# Patient Record
Sex: Female | Born: 1940 | Race: White | Hispanic: No | Marital: Married | Smoking: Never smoker
Health system: Southern US, Community
[De-identification: ages and names within clinical notes are randomized; demographics above are authoritative.]

## PROBLEM LIST (undated history)

## (undated) DIAGNOSIS — K219 Gastro-esophageal reflux disease without esophagitis: Secondary | ICD-10-CM

## (undated) DIAGNOSIS — I10 Essential (primary) hypertension: Secondary | ICD-10-CM

## (undated) DIAGNOSIS — G114 Hereditary spastic paraplegia: Secondary | ICD-10-CM

## (undated) DIAGNOSIS — G959 Disease of spinal cord, unspecified: Secondary | ICD-10-CM

## (undated) DIAGNOSIS — G709 Myoneural disorder, unspecified: Secondary | ICD-10-CM

## (undated) DIAGNOSIS — E7800 Pure hypercholesterolemia, unspecified: Secondary | ICD-10-CM

## (undated) DIAGNOSIS — Z87442 Personal history of urinary calculi: Secondary | ICD-10-CM

## (undated) HISTORY — PX: TONSILLECTOMY: SUR1361

## (undated) HISTORY — PX: BREAST SURGERY: SHX581

---

## 1999-11-15 ENCOUNTER — Encounter: Payer: Self-pay | Admitting: Internal Medicine

## 2008-08-28 ENCOUNTER — Encounter (INDEPENDENT_AMBULATORY_CARE_PROVIDER_SITE_OTHER): Payer: Self-pay | Admitting: *Deleted

## 2009-07-29 ENCOUNTER — Encounter: Payer: Self-pay | Admitting: Internal Medicine

## 2010-03-16 ENCOUNTER — Encounter: Payer: Self-pay | Admitting: Internal Medicine

## 2010-03-17 ENCOUNTER — Encounter: Payer: Self-pay | Admitting: Internal Medicine

## 2010-03-25 ENCOUNTER — Encounter (INDEPENDENT_AMBULATORY_CARE_PROVIDER_SITE_OTHER): Payer: Self-pay | Admitting: *Deleted

## 2010-05-25 ENCOUNTER — Encounter: Payer: Self-pay | Admitting: Internal Medicine

## 2010-06-03 ENCOUNTER — Encounter (INDEPENDENT_AMBULATORY_CARE_PROVIDER_SITE_OTHER): Payer: Self-pay | Admitting: *Deleted

## 2010-06-03 DIAGNOSIS — N6019 Diffuse cystic mastopathy of unspecified breast: Secondary | ICD-10-CM

## 2010-06-03 DIAGNOSIS — R05 Cough: Secondary | ICD-10-CM

## 2010-06-03 DIAGNOSIS — K219 Gastro-esophageal reflux disease without esophagitis: Secondary | ICD-10-CM | POA: Insufficient documentation

## 2010-06-03 DIAGNOSIS — K5730 Diverticulosis of large intestine without perforation or abscess without bleeding: Secondary | ICD-10-CM | POA: Insufficient documentation

## 2010-06-03 DIAGNOSIS — R1319 Other dysphagia: Secondary | ICD-10-CM

## 2010-06-03 DIAGNOSIS — E538 Deficiency of other specified B group vitamins: Secondary | ICD-10-CM

## 2010-06-08 ENCOUNTER — Ambulatory Visit: Payer: Self-pay | Admitting: Internal Medicine

## 2010-06-08 DIAGNOSIS — G114 Hereditary spastic paraplegia: Secondary | ICD-10-CM

## 2010-07-25 ENCOUNTER — Telehealth: Payer: Self-pay | Admitting: Internal Medicine

## 2010-07-26 ENCOUNTER — Ambulatory Visit: Payer: Self-pay | Admitting: Internal Medicine

## 2010-07-28 ENCOUNTER — Encounter: Payer: Self-pay | Admitting: Internal Medicine

## 2010-10-20 NOTE — Procedures (Signed)
Summary: Colonoscopy  Patient: Valerie Palmer Note: All result statuses are Final unless otherwise noted.  Tests: (1) Colonoscopy (COL)   COL Colonoscopy           DONE     [facility]     [address]     [place], [place]  [address]           COLONOSCOPY PROCEDURE REPORT           PATIENT:  Valerie Palmer, Valerie Palmer  MR#:  #[id number]     BIRTHDATE:  1941-01-19, 69 yrs. old  GENDER:  female     ENDOSCOPIST:  Hedwig Morton. Juanda Chance, MD     REF. BY:  Desmond Dike, M.D.     PROCEDURE DATE:  07/26/2010     PROCEDURE:  Colonoscopy 295[address]     ASA CLASS:  Class II     INDICATIONS:  family history of colon cancer mother with colon     cancer. last colon 2001, anemia     MEDICATIONS:   Versed 3 mg, Fentanyl 50 mcg, Benadryl 12.5 mg           DESCRIPTION OF PROCEDURE:   After the risks benefits and     alternatives of the procedure were thoroughly explained, informed     consent was obtained.  Digital rectal exam was performed and     revealed no rectal masses.   The LB PCF-H180AL [id number] endoscope     was introduced through the anus and advanced to the cecum, which     was identified by the ileocecal valve, without limitations.  The     quality of the prep was good, using MiraLax.  The instrument was     then slowly withdrawn as the colon was fully examined.     <<PROCEDUREIMAGES>>           FINDINGS:  Mild diverticulosis was found in the sigmoid colon (see     image4).  Internal hemorrhoids were found (see image6).  This was     otherwise a normal examination of the colon (see image5, image3,     image2, and image1).   Retroflexed views in the rectum revealed no     abnormalities.    The scope was then withdrawn from the patient     and the procedure completed.           COMPLICATIONS:  None     ENDOSCOPIC IMPRESSION:     1) Mild diverticulosis in the sigmoid colon     2) Internal hemorrhoids     3) Otherwise normal examination     RECOMMENDATIONS:     1) high fiber diet     REPEAT EXAM:   In 5 year(s) for.           ______________________________     Hedwig Morton. Juanda Chance, MD           CC:           n.     eSIGNED:   Hedwig Morton. Brodie at 07/26/2010 11:20 AM           Valerie Palmer, #[id number]  Note: An exclamation mark (!) indicates a result that was not dispersed into the flowsheet. Document Creation Date: 07/26/2010 11:20 AM _______________________________________________________________________  (1) Order result status: Final Collection or observation date-time: 07/26/2010 11:02 Requested date-time:  Receipt date-time:  Reported date-time:  Referring Physician:   Ordering Physician: Lina Sar [phone]) Specimen Source:  Source: Valerie Palmer Order Number:  [id number] Lab site:   Appended Document: Colonoscopy    Clinical Lists Changes  Observations: Added new observation of COLONNXTDUE: 07/2015 (07/26/2010 13:17)

## 2010-10-20 NOTE — Assessment & Plan Note (Signed)
Summary: dysphagia, cough...em   History of Present Illness Visit Type: Initial Consult Primary GI MD: Lina Sar MD Primary Provider: Allena Napoleon Requesting Provider: Allena Napoleon Chief Complaint: Dysphagia and cough History of Present Illness:   This is a 50 with white female with a 6 week history of a cough which started with what sounds like an upper respiratory infection. She was treated with antibiotics for 3 weeks and has improved significantly but she still has some nocturnal dry cough. She also has had some esophageal reflux which has improved on Prilosec 20 mg twice a day. A modified barium swallow showed no evidence of aspiration. She has a family history of colon cancer in her mother and underwent a colonoscopy in February 2001 which was normal except for mild diverticulosis of the left colon. She is chronically anemic. In September 2010, her hemoglobin was 11.7 and her MCV was 95. Several weeks ago, her hemoglobin was at 11.9, hematocrit 38 and MCV was again 95. She denies any dysphagia or odynophagia. She has had hoarseness. Her weight has remained stable.   GI Review of Systems    Reports abdominal pain, belching, and  heartburn.      Denies acid reflux, bloating, chest pain, dysphagia with liquids, dysphagia with solids, loss of appetite, nausea, vomiting, vomiting blood, weight loss, and  weight gain.      Reports constipation.     Denies anal fissure, black tarry stools, change in bowel habit, diarrhea, diverticulosis, fecal incontinence, heme positive stool, hemorrhoids, irritable bowel syndrome, jaundice, light color stool, liver problems, rectal bleeding, and  rectal pain.    Current Medications (verified): 1)  Diclofenac Potassium 50 Mg Tabs (Diclofenac Potassium) .Marland Kitchen.. 1 By Mouth Once Daily 2)  Omeprazole 20 Mg Cpdr (Omeprazole) .Marland Kitchen.. 1 By Mouth Two Times A Day 3)  Vesicare 10 Mg Tabs (Solifenacin Succinate) .... Take 1 Tablet By Mouth Once A Day 4)  Vitamin D3  50000 Unit Caps (Cholecalciferol) .Marland Kitchen.. 1 By Mouth Every Other Week 5)  Simvastatin 40 Mg Tabs (Simvastatin) .... Take 1 Tab By Mouth At Bedtime 6)  Folplex 2.2 2.2-25-0.5 Mg Tabs (Folic Acid-Vit B6-Vit B12) .... Take 1 Tablet By Mouth Once Daily 7)  Benicar 40 Mg Tabs (Olmesartan Medoxomil) .Marland Kitchen.. 1 By Mouth Once Daily 8)  Metamucil Plus Calcium  Caps (Psyllium-Calcium) .... 3 By Mouth Every Am and 2 By Mouth At Bedtime  Allergies (verified): 1)  ! * Diilaudid 2)  ! * Opiods 3)  ! 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 Family History of Colon Cancer: Mother Family History of Diabetes: Mother Family History of Heart Disease: Father  Social History: Occupation: [employer]  retired Alcohol Use - 1 per day Illicit Drug Use - no Married-husband in nursing home Daily Caffeine Use 1-2 per day coffee  Review of Systems       The patient complains of cough.  The patient denies allergy/sinus, anemia, anxiety-new, arthritis/joint pain, back pain, blood in urine, breast changes/lumps, change in vision, confusion, coughing up blood, depression-new, fainting, fatigue, fever, headaches-new, hearing problems, heart murmur, heart rhythm changes, itching, menstrual pain, muscle pains/cramps, night sweats, nosebleeds, pregnancy symptoms, shortness of breath, skin rash,  sleeping problems, sore throat, swelling of feet/legs, swollen lymph glands, thirst - excessive , urination - excessive , urination changes/pain, urine leakage, vision changes, and voice change.         Pertinent  positive and negative review of systems were noted in the above HPI. All other ROS was otherwise negative.   Vital Signs:  Patient profile:   71 year old female Height:      64 inches Weight:      126 pounds BMI:     21.71 BSA:     1.61 Pulse rate:   80 / minute Pulse rhythm:   regular BP sitting:   104 / 64  (left arm)  Vitals Entered By: Merri Ray CMA (AAMA) (June 08, 2010 9:30 AM)  Physical Exam  General:  alert and oriented, her voice is normal today. Mouth:  no thrush. Neck:  Supple; no masses or thyromegaly. Lungs:  Clear throughout to auscultation. Heart:  Regular rate and rhythm; no murmurs, rubs,  or bruits. Rapid S1-S2. Abdomen:  soft mildly protuberant abdomen without tenderness. Increased tympany in the upper abdomen. Normoactive bowel sounds. Rectal:  soft Hemoccult negative stool. Extremities:  patient has spastic paraplegia, and ambulates with a walker. She has atrophy of her muscles distally in both legs. Skin:  Intact without significant lesions or rashes. Psych:  Alert and cooperative. Normal mood and affect.   Impression & Recommendations:  Problem # 1:  Family Hx of COLON CANCER (ICD-153.9)  Patient is due for a repeat colonoscopy. Her last one was 10 years ago. We will schedule her for a colonoscopy.  Orders: Colon/Endo (Colon/Endo)  Problem # 2:  GERD (ICD-530.81)  Patient has gastroesophageal reflux aggravated by coughing. Her cough may also be partly a result of the reflux. We will proceed with an upper endoscopy to rule out Barrett's esophagus or Candida esophagitis.  Orders: Colon/Endo (Colon/Endo)  Problem # 3:  DYSPHAGIA (UVO-536.64)  Patient's dysphagia has actually improved on Prilosec 20 mg twice a day.  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 She is followed by neurology.  Patient Instructions: 1)  continue Prilosec to 20 mg twice a  day. 2)  Antireflux measures. 3)  Schedule colonoscopy. 4)  Schedule upper endoscopy with possible dilatation if indicated. 5)  Copy sent to : Dr Sheria Lang 6)  The medication list was reviewed and reconciled.  All changed / newly prescribed medications were explained.  A complete medication list was provided to the patient / caregiver. Prescriptions: DULCOLAX 5 MG  TBEC (BISACODYL) Day before procedure take 2 at 3pm and 2 at 8pm.  #4 x 0   Entered by:   Lamona Curl CMA (AAMA)   Authorized by:   Hart Carwin MD   Signed by:   Lamona Curl CMA (AAMA) on 06/08/2010   Method used:   Electronically to        [facility] [address] [phone]* (retail)       [address] [place], [place]  [address]       Ph: [phone]       Fax: [phone]   RxID:   [id number] REGLAN 10 MG  TABS (METOCLOPRAMIDE HCL) As per prep instructions.  #2 x 0   Entered by:   Lamona Curl CMA (AAMA)   Authorized by:   Hart Carwin MD   Signed by:   Lamona Curl CMA (AAMA)  on 06/08/2010   Method used:   Electronically to        [facility]. [phone]* (retail)       [address] [place], [place]  [address]       Ph: [phone]       Fax: [phone]   RxID:   [id number] MIRALAX   POWD (POLYETHYLENE GLYCOL 3350) As per prep  instructions.  #255gm x 0   Entered by:   Lamona Curl CMA (AAMA)   Authorized by:   Hart Carwin MD   Signed by:   Lamona Curl CMA (AAMA) on 06/08/2010   Method used:   Electronically to        [facility] [address] [phone]* (retail)       [address] [place], [place]  [address]       Ph: [phone]       Fax: [phone]   RxID:   [id number]

## 2010-10-20 NOTE — Letter (Signed)
Summary: Nurse Note [facility]  Nurse Note [facility]   Imported By: Lamona Curl CMA (AAMA) 07/27/2010 09:01:17  _____________________________________________________________________  External Attachment:    Type:   Image     Comment:   External Document

## 2010-10-20 NOTE — Procedures (Signed)
Summary: Upper Endoscopy  Patient: Valerie Palmer Note: All result statuses are Final unless otherwise noted.  Tests: (1) Upper Endoscopy (EGD)   EGD Upper Endoscopy       DONE     [facility]     [address]     [place], [place]  [address]           OPERATIVE PROCEDURE REPORT           PATIENT:  Adison, Reifsteck  MR#:  #[id number]     BIRTHDATE:  [birth year]  GENDER:  female           ENDOSCOPIST:  Hedwig Morton. Juanda Chance, MD     ASSISTANT:           PROCEDURE DATE:  07/26/2010     PRE-PROCEDURE PREPERATION:     PRE-PROCEDURE PHYSICAL:  Desmond Dike, MD     PROCEDURE:  EGD with biopsy, [phone]     ASA CLASS:  Class II     INDICATIONS:  1) cough  2) GERD anemia,           MEDICATIONS:  Versed 7 mg, Benadryl 32.5 mg     TOPICAL ANESTHETIC:  Exactacain Spray           DESCRIPTION OF PROCEDURE:   After the risks benefits and     alternatives of the procedure were thoroughly explained, informed     consent was obtained.  The LB GIF-H180 [id number] endoscope was     introduced through the mouth and advanced to the second portion of     the duodenum, without limitations.  The instrument was slowly     withdrawn as the mucosa was fully examined.     <<PROCEDUREIMAGES>>           A hiatal hernia was found (see image1 and image6). 3 cm     nonreducible hiatal hernia  Normal GE junction was noted. With     standard forceps, a biopsy was obtained and sent to pathology (see     image1).  Otherwise the examination was normal. With standard     forceps, a biopsy was obtained and sent to pathology (see image5,     image4, image3, and image2). s/p duodenal biopsy to r/o villous     atrophy    Retroflexed views revealed no abnormalities.    The     scope was then withdrawn from the patient and the procedure     terminated.           COMPLICATIONS:  None           IMPRESSION:  1) Hiatal hernia     2) Normal GE junction     3) Otherwise normal examination     s/p biopsies from g-e junction and  from duodenum     RECOMMENDATIONS:  1) Await biopsy results     2) Anti-reflux regimen to be follow     continue Prelosec 20 mg po bid, cough contributing to the reflus     and vice versa           REPEAT EXAM:  In 0 year(s) for.     DISCHARGE INSTRUCTIONS:           ______________________________     Hedwig Morton. Juanda Chance, MD           CPT CODES:           DIAGNOSIS CODES:  CC:           n.     eSIGNED:   Dora M. Brodie at 07/26/2010 11:11 AM           Braulio Conte, #[id number]  Note: An exclamation mark (!) indicates a result that was not dispersed into the flowsheet. Document Creation Date: 07/26/2010 11:11 AM _______________________________________________________________________  (1) Order result status: Final Collection or observation date-time: 07/26/2010 10:36 Requested date-time:  Receipt date-time:  Reported date-time:  Referring Physician:   Ordering Physician: Lina Sar [phone]) Specimen Source:  Source: Launa Grill Order Number: [id number] Lab site:

## 2010-10-20 NOTE — Letter (Signed)
Summary: Patient [facility] Biopsy Results  [facility]  [address] [place], [place] [address]   Phone: [phone]  Fax: [phone]        July 28, 2010 MRN: [id number]    [facility] [address] [place], [place]  [address]    Dear Ms. Dario,  I am pleased to inform you that the biopsies taken during your recent endoscopic examination did not show any evidence of cancer upon pathologic examination.  Additional information/recommendations:  __No further action is needed at this time.  Please follow-up with      your primary care physician for your other healthcare needs.  __ Please call [phone] to schedule a return visit to review      your condition.  _x_ Continue with the treatment plan as outlined on the day of your      exam.  __   Please call us if you are having persistent problems or have questions about your condition that have not been fully answered at this time.  Sincerely,  Hart Carwin MD  This letter has been electronically signed by your physician.  Appended Document: Patient Notice-Endo Biopsy Results letter mailed 11.15.2011

## 2010-10-20 NOTE — Letter (Signed)
Summary: New Patient letter  [facility]  [address] [place], [place] [address]   Phone: [phone]  Fax: [phone]       03/25/2010 MRN: [id number]  [facility] [address] [place], [place]  [address]  Dear Valerie Palmer,  Welcome to the Gastroenterology Division at [facility].    You are scheduled to see Dr.  Lina Sar on June 08, 2010 at 9:15am on the 3rd floor at [facility], [address]  We ask that you try to arrive at our office 15 minutes prior to your appointment time to allow for check-in.  We would like you to complete the enclosed self-administered evaluation form prior to your visit and bring it with you on the day of your appointment.  We will review it with you.  Also, please bring a complete list of all your medications or, if you prefer, bring the medication bottles and we will list them.  Please bring your insurance card so that we may make a copy of it.  If your insurance requires a referral to see a specialist, please bring your referral form from your primary care physician.  Co-payments are due at the time of your visit and may be paid by cash, check or credit card.     Your office visit will consist of a consult with your physician (includes a physical exam), any laboratory testing he/she may order, scheduling of any necessary diagnostic testing (e.g. x-ray, ultrasound, CT-scan), and scheduling of a procedure (e.g. Endoscopy, Colonoscopy) if required.  Please allow enough time on your schedule to allow for any/all of these possibilities.    If you cannot keep your appointment, please call [phone] to cancel or reschedule prior to your appointment date.  This allows [place] the opportunity to schedule an appointment for another patient in need of care.  If you do not cancel or reschedule by 5 p.m. the business day prior to your appointment date, you will be charged a $50.00 late cancellation/no-show fee.    Thank you for  choosing [facility] for your medical needs.  We appreciate the opportunity to care for you.  Please visit [place] at our website  to learn more about our practice.                     Sincerely,                                                             The Gastroenterology Division

## 2010-10-20 NOTE — Letter (Signed)
Summary: [facility]  [facility]   Imported By: Lamona Curl CMA (AAMA) 06/03/2010 16:55:38  _____________________________________________________________________  External Attachment:    Type:   Image     Comment:   External Document

## 2010-10-20 NOTE — Progress Notes (Signed)
Summary: ? Allergy to Medication  Phone Note Outgoing Call   Action Taken: Phone Call Completed Summary of Call: I have called the pt concerning "allergy to a pain med" used 2 yrs ago at [facility], it was not Codeine. Dr Saddie Benders has the information, will call him. Initial call taken by: Hart Carwin MD,  July 25, 2010 1:49 PM  Follow-up for Phone Call        Per [facility] handwritten notes that were faxed to our office, patient has "shivers" with Demerol (meperidine). Patient's allergy list has been updated in our system. Follow-up by: Lamona Curl CMA Duncan Dull),  July 25, 2010 2:20 PM   New Allergies: ! * DIILAUDID ! * ANALGESICS ! DEMEROL New Allergies: ! * DIILAUDID ! * ANALGESICS ! DEMEROL

## 2010-10-20 NOTE — Letter (Signed)
Summary: [facility]   Imported By: Sherian Rein 06/04/2010 08:35:32  _____________________________________________________________________  External Attachment:    Type:   Image     Comment:   External Document

## 2010-10-20 NOTE — Letter (Signed)
Summary: [facility] Instructions  [facility]  [address] [place], [place] [address]   Phone: [phone]  Fax: [phone]       Valerie Palmer    04-Dec-1940    MRN: [id number]       Procedure Day /Date: Tuesday 07/26/10     Arrival Time: 9:00 am     Procedure Time: 10:00 am     Location of Procedure:                    _x _  [facility] (4th Floor)  PREPARATION FOR COLONOSCOPY WITH MIRALAX  Starting 5 days prior to your procedure 07/21/10 do not eat nuts, seeds, popcorn, corn, beans, peas,  salads, or any raw vegetables.  Do not take any fiber supplements (e.g. Metamucil, Citrucel, and Benefiber). ____________________________________________________________________________________________________   THE DAY BEFORE YOUR PROCEDURE         DATE: 07/25/10 DAY: Monday  1   Drink clear liquids the entire day-NO SOLID FOOD  2   Do not drink anything colored red or purple.  Avoid juices with pulp.  No orange juice.  3   Drink at least 64 oz. (8 glasses) of fluid/clear liquids during the day to prevent dehydration and help the prep work efficiently.  CLEAR LIQUIDS INCLUDE: Water Jello Ice Popsicles Tea (sugar ok, no milk/cream) Powdered fruit flavored drinks Coffee (sugar ok, no milk/cream) Gatorade Juice: apple, white grape, white cranberry  Lemonade Clear bullion, consomm, broth Carbonated beverages (any kind) Strained chicken noodle soup Hard Candy  4   Mix the entire bottle of Miralax with 64 oz. of Gatorade/Powerade in the morning and put in the refrigerator to chill.  5   At 3:00 pm take 2 Dulcolax/Bisacodyl tablets.  6   At 4:30 pm take one Reglan/Metoclopramide tablet.  7  Starting at 5:00 pm drink one 8 oz glass of the Miralax mixture every 15-20 minutes until you have finished drinking the entire 64 oz.  You should finish drinking prep around 7:30 or 8:00 pm.  8   If you are nauseated, you may take the 2nd Reglan/Metoclopramide tablet  at 6:30 pm.        9    At 8:00 pm take 2 more DULCOLAX/Bisacodyl tablets.         THE DAY OF YOUR PROCEDURE      DATE:  07/26/10 DAY: Tuesday  You may drink clear liquids until 8:00 am  (2 HOURS BEFORE PROCEDURE).   MEDICATION INSTRUCTIONS  Unless otherwise instructed, you should take regular prescription medications with a small sip of water as early as possible the morning of your procedure.          OTHER INSTRUCTIONS  You will need a responsible adult at least 71 years of age to accompany you and drive you home.   This person must remain in the waiting room during your procedure.  Wear loose fitting clothing that is easily removed.  Leave jewelry and other valuables at home.  However, you may wish to bring a book to read or an iPod/MP3 player to listen to music as you wait for your procedure to start.  Remove all body piercing jewelry and leave at home.  Total time from sign-in until discharge is approximately 2-3 hours.  You should go home directly after your procedure and rest.  You can resume normal activities the day after your procedure.  The day of your procedure you should not:   Drive  Make legal decisions   Operate machinery   Drink alcohol   Return to work  You will receive specific instructions about eating, activities and medications before you leave.   The above instructions have been reviewed and explained to me by   Lamona Curl CMA Duncan Dull)  June 08, 2010 10:29 AM     I fully understand and can verbalize these instructions _____________________________ Date 06/08/10

## 2010-10-21 NOTE — Procedures (Signed)
Summary: [facility]   Imported By: Sherian Rein 06/04/2010 08:34:08  _____________________________________________________________________  External Attachment:    Type:   Image     Comment:   External Document

## 2011-11-13 DIAGNOSIS — R109 Unspecified abdominal pain: Secondary | ICD-10-CM | POA: Diagnosis not present

## 2011-11-13 DIAGNOSIS — N200 Calculus of kidney: Secondary | ICD-10-CM | POA: Diagnosis not present

## 2011-11-13 DIAGNOSIS — R1084 Generalized abdominal pain: Secondary | ICD-10-CM | POA: Diagnosis not present

## 2011-11-15 DIAGNOSIS — R110 Nausea: Secondary | ICD-10-CM | POA: Diagnosis not present

## 2011-11-15 DIAGNOSIS — M545 Low back pain: Secondary | ICD-10-CM | POA: Diagnosis not present

## 2012-07-01 DIAGNOSIS — M461 Sacroiliitis, not elsewhere classified: Secondary | ICD-10-CM | POA: Diagnosis not present

## 2012-07-01 DIAGNOSIS — Z23 Encounter for immunization: Secondary | ICD-10-CM | POA: Diagnosis not present

## 2012-07-19 DIAGNOSIS — Z79899 Other long term (current) drug therapy: Secondary | ICD-10-CM | POA: Diagnosis not present

## 2012-07-19 DIAGNOSIS — M810 Age-related osteoporosis without current pathological fracture: Secondary | ICD-10-CM | POA: Diagnosis not present

## 2012-07-19 DIAGNOSIS — Z780 Asymptomatic menopausal state: Secondary | ICD-10-CM | POA: Diagnosis not present

## 2012-07-19 DIAGNOSIS — I10 Essential (primary) hypertension: Secondary | ICD-10-CM | POA: Diagnosis not present

## 2012-07-19 DIAGNOSIS — E7800 Pure hypercholesterolemia, unspecified: Secondary | ICD-10-CM | POA: Diagnosis not present

## 2012-07-19 DIAGNOSIS — R32 Unspecified urinary incontinence: Secondary | ICD-10-CM | POA: Diagnosis not present

## 2012-07-30 DIAGNOSIS — Z1231 Encounter for screening mammogram for malignant neoplasm of breast: Secondary | ICD-10-CM | POA: Diagnosis not present

## 2012-08-22 DIAGNOSIS — H2510 Age-related nuclear cataract, unspecified eye: Secondary | ICD-10-CM | POA: Diagnosis not present

## 2012-08-26 DIAGNOSIS — M810 Age-related osteoporosis without current pathological fracture: Secondary | ICD-10-CM | POA: Diagnosis not present

## 2012-08-26 DIAGNOSIS — Z780 Asymptomatic menopausal state: Secondary | ICD-10-CM | POA: Diagnosis not present

## 2012-11-13 DIAGNOSIS — N200 Calculus of kidney: Secondary | ICD-10-CM | POA: Diagnosis not present

## 2012-11-13 DIAGNOSIS — N3941 Urge incontinence: Secondary | ICD-10-CM | POA: Diagnosis not present

## 2012-11-13 DIAGNOSIS — M545 Low back pain: Secondary | ICD-10-CM | POA: Diagnosis not present

## 2013-01-02 DIAGNOSIS — J069 Acute upper respiratory infection, unspecified: Secondary | ICD-10-CM | POA: Diagnosis not present

## 2013-01-27 DIAGNOSIS — Z0000 Encounter for general adult medical examination without abnormal findings: Secondary | ICD-10-CM | POA: Diagnosis not present

## 2013-01-27 DIAGNOSIS — G114 Hereditary spastic paraplegia: Secondary | ICD-10-CM | POA: Diagnosis not present

## 2013-04-15 DIAGNOSIS — H2510 Age-related nuclear cataract, unspecified eye: Secondary | ICD-10-CM | POA: Diagnosis not present

## 2013-04-21 DIAGNOSIS — H18419 Arcus senilis, unspecified eye: Secondary | ICD-10-CM | POA: Diagnosis not present

## 2013-04-21 DIAGNOSIS — H25019 Cortical age-related cataract, unspecified eye: Secondary | ICD-10-CM | POA: Diagnosis not present

## 2013-04-21 DIAGNOSIS — H02839 Dermatochalasis of unspecified eye, unspecified eyelid: Secondary | ICD-10-CM | POA: Diagnosis not present

## 2013-04-21 DIAGNOSIS — H2510 Age-related nuclear cataract, unspecified eye: Secondary | ICD-10-CM | POA: Diagnosis not present

## 2013-04-21 DIAGNOSIS — H25049 Posterior subcapsular polar age-related cataract, unspecified eye: Secondary | ICD-10-CM | POA: Diagnosis not present

## 2013-05-12 DIAGNOSIS — R51 Headache: Secondary | ICD-10-CM | POA: Diagnosis not present

## 2013-06-08 DIAGNOSIS — H2510 Age-related nuclear cataract, unspecified eye: Secondary | ICD-10-CM | POA: Diagnosis not present

## 2013-06-09 DIAGNOSIS — H2510 Age-related nuclear cataract, unspecified eye: Secondary | ICD-10-CM | POA: Diagnosis not present

## 2013-06-09 DIAGNOSIS — H269 Unspecified cataract: Secondary | ICD-10-CM | POA: Diagnosis not present

## 2013-06-10 DIAGNOSIS — H2510 Age-related nuclear cataract, unspecified eye: Secondary | ICD-10-CM | POA: Diagnosis not present

## 2013-07-15 DIAGNOSIS — Z23 Encounter for immunization: Secondary | ICD-10-CM | POA: Diagnosis not present

## 2013-07-28 DIAGNOSIS — I10 Essential (primary) hypertension: Secondary | ICD-10-CM | POA: Diagnosis not present

## 2013-07-28 DIAGNOSIS — M818 Other osteoporosis without current pathological fracture: Secondary | ICD-10-CM | POA: Diagnosis not present

## 2013-07-28 DIAGNOSIS — E7800 Pure hypercholesterolemia, unspecified: Secondary | ICD-10-CM | POA: Diagnosis not present

## 2013-07-28 DIAGNOSIS — M810 Age-related osteoporosis without current pathological fracture: Secondary | ICD-10-CM | POA: Diagnosis not present

## 2013-07-28 DIAGNOSIS — Z006 Encounter for examination for normal comparison and control in clinical research program: Secondary | ICD-10-CM | POA: Diagnosis not present

## 2013-07-28 DIAGNOSIS — Z79899 Other long term (current) drug therapy: Secondary | ICD-10-CM | POA: Diagnosis not present

## 2013-07-28 DIAGNOSIS — N3941 Urge incontinence: Secondary | ICD-10-CM | POA: Diagnosis not present

## 2013-07-28 DIAGNOSIS — K219 Gastro-esophageal reflux disease without esophagitis: Secondary | ICD-10-CM | POA: Diagnosis not present

## 2013-08-01 DIAGNOSIS — H2510 Age-related nuclear cataract, unspecified eye: Secondary | ICD-10-CM | POA: Diagnosis not present

## 2013-08-01 DIAGNOSIS — H269 Unspecified cataract: Secondary | ICD-10-CM | POA: Diagnosis not present

## 2013-08-05 DIAGNOSIS — Z1231 Encounter for screening mammogram for malignant neoplasm of breast: Secondary | ICD-10-CM | POA: Diagnosis not present

## 2014-06-16 DIAGNOSIS — Z961 Presence of intraocular lens: Secondary | ICD-10-CM | POA: Diagnosis not present

## 2014-07-03 DIAGNOSIS — Z23 Encounter for immunization: Secondary | ICD-10-CM | POA: Diagnosis not present

## 2014-07-27 DIAGNOSIS — E559 Vitamin D deficiency, unspecified: Secondary | ICD-10-CM | POA: Diagnosis not present

## 2014-07-27 DIAGNOSIS — E782 Mixed hyperlipidemia: Secondary | ICD-10-CM | POA: Diagnosis not present

## 2014-07-27 DIAGNOSIS — E039 Hypothyroidism, unspecified: Secondary | ICD-10-CM | POA: Diagnosis not present

## 2014-07-27 DIAGNOSIS — Z79899 Other long term (current) drug therapy: Secondary | ICD-10-CM | POA: Diagnosis not present

## 2014-08-03 DIAGNOSIS — I10 Essential (primary) hypertension: Secondary | ICD-10-CM | POA: Diagnosis not present

## 2014-08-03 DIAGNOSIS — G114 Hereditary spastic paraplegia: Secondary | ICD-10-CM | POA: Diagnosis not present

## 2014-08-03 DIAGNOSIS — Z0000 Encounter for general adult medical examination without abnormal findings: Secondary | ICD-10-CM | POA: Diagnosis not present

## 2014-08-03 DIAGNOSIS — Z23 Encounter for immunization: Secondary | ICD-10-CM | POA: Diagnosis not present

## 2014-08-03 DIAGNOSIS — E559 Vitamin D deficiency, unspecified: Secondary | ICD-10-CM | POA: Diagnosis not present

## 2014-08-03 DIAGNOSIS — E780 Pure hypercholesterolemia: Secondary | ICD-10-CM | POA: Diagnosis not present

## 2014-08-11 DIAGNOSIS — Z1231 Encounter for screening mammogram for malignant neoplasm of breast: Secondary | ICD-10-CM | POA: Diagnosis not present

## 2014-08-11 DIAGNOSIS — N200 Calculus of kidney: Secondary | ICD-10-CM | POA: Diagnosis not present

## 2014-08-11 DIAGNOSIS — R109 Unspecified abdominal pain: Secondary | ICD-10-CM | POA: Diagnosis not present

## 2015-02-04 ENCOUNTER — Encounter: Payer: Self-pay | Admitting: Internal Medicine

## 2015-03-02 DIAGNOSIS — M7989 Other specified soft tissue disorders: Secondary | ICD-10-CM | POA: Diagnosis not present

## 2015-06-23 DIAGNOSIS — Z23 Encounter for immunization: Secondary | ICD-10-CM | POA: Diagnosis not present

## 2015-08-09 DIAGNOSIS — E039 Hypothyroidism, unspecified: Secondary | ICD-10-CM | POA: Diagnosis not present

## 2015-08-09 DIAGNOSIS — Z0000 Encounter for general adult medical examination without abnormal findings: Secondary | ICD-10-CM | POA: Diagnosis not present

## 2015-08-09 DIAGNOSIS — K210 Gastro-esophageal reflux disease with esophagitis: Secondary | ICD-10-CM | POA: Diagnosis not present

## 2015-08-09 DIAGNOSIS — Z79899 Other long term (current) drug therapy: Secondary | ICD-10-CM | POA: Diagnosis not present

## 2015-08-09 DIAGNOSIS — Z1239 Encounter for other screening for malignant neoplasm of breast: Secondary | ICD-10-CM | POA: Diagnosis not present

## 2015-08-09 DIAGNOSIS — E782 Mixed hyperlipidemia: Secondary | ICD-10-CM | POA: Diagnosis not present

## 2015-08-09 DIAGNOSIS — I10 Essential (primary) hypertension: Secondary | ICD-10-CM | POA: Diagnosis not present

## 2015-08-18 DIAGNOSIS — Z1231 Encounter for screening mammogram for malignant neoplasm of breast: Secondary | ICD-10-CM | POA: Diagnosis not present

## 2015-10-09 ENCOUNTER — Encounter: Payer: Self-pay | Admitting: Gastroenterology

## 2015-10-12 DIAGNOSIS — G959 Disease of spinal cord, unspecified: Secondary | ICD-10-CM | POA: Diagnosis not present

## 2015-10-12 DIAGNOSIS — I10 Essential (primary) hypertension: Secondary | ICD-10-CM | POA: Diagnosis not present

## 2016-05-03 DIAGNOSIS — N3946 Mixed incontinence: Secondary | ICD-10-CM | POA: Diagnosis not present

## 2016-05-17 DIAGNOSIS — N3941 Urge incontinence: Secondary | ICD-10-CM | POA: Diagnosis not present

## 2016-05-25 DIAGNOSIS — N310 Uninhibited neuropathic bladder, not elsewhere classified: Secondary | ICD-10-CM | POA: Diagnosis not present

## 2016-05-25 DIAGNOSIS — N3941 Urge incontinence: Secondary | ICD-10-CM | POA: Diagnosis not present

## 2016-06-26 DIAGNOSIS — H524 Presbyopia: Secondary | ICD-10-CM | POA: Diagnosis not present

## 2016-06-26 DIAGNOSIS — Z961 Presence of intraocular lens: Secondary | ICD-10-CM | POA: Diagnosis not present

## 2016-06-26 DIAGNOSIS — H52222 Regular astigmatism, left eye: Secondary | ICD-10-CM | POA: Diagnosis not present

## 2016-06-26 DIAGNOSIS — H16143 Punctate keratitis, bilateral: Secondary | ICD-10-CM | POA: Diagnosis not present

## 2016-06-29 DIAGNOSIS — Z23 Encounter for immunization: Secondary | ICD-10-CM | POA: Diagnosis not present

## 2016-08-14 DIAGNOSIS — G959 Disease of spinal cord, unspecified: Secondary | ICD-10-CM | POA: Diagnosis not present

## 2016-08-14 DIAGNOSIS — I10 Essential (primary) hypertension: Secondary | ICD-10-CM | POA: Diagnosis not present

## 2016-08-14 DIAGNOSIS — Z23 Encounter for immunization: Secondary | ICD-10-CM | POA: Diagnosis not present

## 2016-08-14 DIAGNOSIS — K219 Gastro-esophageal reflux disease without esophagitis: Secondary | ICD-10-CM | POA: Diagnosis not present

## 2016-08-14 DIAGNOSIS — E785 Hyperlipidemia, unspecified: Secondary | ICD-10-CM | POA: Diagnosis not present

## 2016-08-14 DIAGNOSIS — Z0000 Encounter for general adult medical examination without abnormal findings: Secondary | ICD-10-CM | POA: Diagnosis not present

## 2016-08-14 DIAGNOSIS — Z79899 Other long term (current) drug therapy: Secondary | ICD-10-CM | POA: Diagnosis not present

## 2016-08-25 DIAGNOSIS — Z1231 Encounter for screening mammogram for malignant neoplasm of breast: Secondary | ICD-10-CM | POA: Diagnosis not present

## 2016-12-22 DIAGNOSIS — Z87442 Personal history of urinary calculi: Secondary | ICD-10-CM | POA: Diagnosis not present

## 2016-12-22 DIAGNOSIS — N3946 Mixed incontinence: Secondary | ICD-10-CM | POA: Diagnosis not present

## 2016-12-22 DIAGNOSIS — R3121 Asymptomatic microscopic hematuria: Secondary | ICD-10-CM | POA: Diagnosis not present

## 2016-12-22 DIAGNOSIS — N310 Uninhibited neuropathic bladder, not elsewhere classified: Secondary | ICD-10-CM | POA: Diagnosis not present

## 2016-12-29 DIAGNOSIS — R3121 Asymptomatic microscopic hematuria: Secondary | ICD-10-CM | POA: Diagnosis not present

## 2016-12-29 DIAGNOSIS — N200 Calculus of kidney: Secondary | ICD-10-CM | POA: Diagnosis not present

## 2017-01-19 DIAGNOSIS — N3941 Urge incontinence: Secondary | ICD-10-CM | POA: Diagnosis not present

## 2017-01-26 DIAGNOSIS — N3941 Urge incontinence: Secondary | ICD-10-CM | POA: Diagnosis not present

## 2017-02-02 DIAGNOSIS — N3941 Urge incontinence: Secondary | ICD-10-CM | POA: Diagnosis not present

## 2017-02-09 DIAGNOSIS — N3941 Urge incontinence: Secondary | ICD-10-CM | POA: Diagnosis not present

## 2017-02-16 DIAGNOSIS — N3941 Urge incontinence: Secondary | ICD-10-CM | POA: Diagnosis not present

## 2017-02-23 DIAGNOSIS — N3941 Urge incontinence: Secondary | ICD-10-CM | POA: Diagnosis not present

## 2017-03-02 DIAGNOSIS — N3941 Urge incontinence: Secondary | ICD-10-CM | POA: Diagnosis not present

## 2017-03-02 DIAGNOSIS — N3946 Mixed incontinence: Secondary | ICD-10-CM | POA: Diagnosis not present

## 2017-03-09 DIAGNOSIS — N3941 Urge incontinence: Secondary | ICD-10-CM | POA: Diagnosis not present

## 2017-03-19 DIAGNOSIS — N3946 Mixed incontinence: Secondary | ICD-10-CM | POA: Diagnosis not present

## 2017-03-19 DIAGNOSIS — N200 Calculus of kidney: Secondary | ICD-10-CM | POA: Diagnosis not present

## 2017-03-23 DIAGNOSIS — N3941 Urge incontinence: Secondary | ICD-10-CM | POA: Diagnosis not present

## 2017-03-23 DIAGNOSIS — N3946 Mixed incontinence: Secondary | ICD-10-CM | POA: Diagnosis not present

## 2017-03-30 DIAGNOSIS — N3941 Urge incontinence: Secondary | ICD-10-CM | POA: Diagnosis not present

## 2017-04-06 DIAGNOSIS — N3941 Urge incontinence: Secondary | ICD-10-CM | POA: Diagnosis not present

## 2017-04-13 DIAGNOSIS — N3941 Urge incontinence: Secondary | ICD-10-CM | POA: Diagnosis not present

## 2017-04-30 DIAGNOSIS — R3121 Asymptomatic microscopic hematuria: Secondary | ICD-10-CM | POA: Diagnosis not present

## 2017-04-30 DIAGNOSIS — N200 Calculus of kidney: Secondary | ICD-10-CM | POA: Diagnosis not present

## 2017-04-30 DIAGNOSIS — N3941 Urge incontinence: Secondary | ICD-10-CM | POA: Diagnosis not present

## 2017-05-01 ENCOUNTER — Other Ambulatory Visit: Payer: Self-pay | Admitting: Urology

## 2017-05-04 DIAGNOSIS — N3941 Urge incontinence: Secondary | ICD-10-CM | POA: Diagnosis not present

## 2017-05-23 ENCOUNTER — Encounter (HOSPITAL_COMMUNITY): Payer: Self-pay | Admitting: *Deleted

## 2017-05-25 DIAGNOSIS — N3941 Urge incontinence: Secondary | ICD-10-CM | POA: Diagnosis not present

## 2017-05-26 NOTE — Discharge Instructions (Signed)
Lithotripsy, Care After °This sheet gives you information about how to care for yourself after your procedure. Your health care provider may also give you more specific instructions. If you have problems or questions, contact your health care provider. °What can I expect after the procedure? °After the procedure, it is common to have: °· Some blood in your urine. This should only last for a few days. °· Soreness in your back, sides, or upper abdomen for a few days. °· Blotches or bruises on your back where the pressure wave entered the skin. °· Pain, discomfort, or nausea when pieces (fragments) of the kidney stone move through the tube that carries urine from the kidney to the bladder (ureter). Stone fragments may pass soon after the procedure, but they may continue to pass for up to 4-8 weeks. °? If you have severe pain or nausea, contact your health care provider. This may be caused by a large stone that was not broken up, and this may mean that you need more treatment. °· Some pain or discomfort during urination. °· Some pain or discomfort in the lower abdomen or (in men) at the base of the penis. ° °Follow these instructions at home: °Medicines °· Take over-the-counter and prescription medicines only as told by your health care provider. °· If you were prescribed an antibiotic medicine, take it as told by your health care provider. Do not stop taking the antibiotic even if you start to feel better. °· Do not drive for 24 hours if you were given a medicine to help you relax (sedative). °· Do not drive or use heavy machinery while taking prescription pain medicine. °Eating and drinking °· Drink enough water and fluids to keep your urine clear or pale yellow. This helps any remaining pieces of the stone to pass. It can also help prevent new stones from forming. °· Eat plenty of fresh fruits and vegetables. °· Follow instructions from your health care provider about eating and drinking restrictions. You may be  instructed: °? To reduce how much salt (sodium) you eat or drink. Check ingredients and nutrition facts on packaged foods and beverages. °? To reduce how much meat you eat. °· Eat the recommended amount of calcium for your age and gender. Ask your health care provider how much calcium you should have. °General instructions °· Get plenty of rest. °· Most people can resume normal activities 1-2 days after the procedure. Ask your health care provider what activities are safe for you. °· If directed, strain all urine through the strainer that was provided by your health care provider. °? Keep all fragments for your health care provider to see. Any stones that are found may be sent to a medical lab for examination. The stone may be as small as a grain of salt. °· Keep all follow-up visits as told by your health care provider. This is important. °Contact a health care provider if: °· You have pain that is severe or does not get better with medicine. °· You have nausea that is severe or does not go away. °· You have blood in your urine longer than your health care provider told you to expect. °· You have more blood in your urine. °· You have pain during urination that does not go away. °· You urinate more frequently than usual and this does not go away. °· You develop a rash or any other possible signs of an allergic reaction. °Get help right away if: °· You have severe pain in   your back, sides, or upper abdomen. °· You have severe pain while urinating. °· Your urine is very dark red. °· You have blood in your stool (feces). °· You cannot pass any urine at all. °· You feel a strong urge to urinate after emptying your bladder. °· You have a fever or chills. °· You develop shortness of breath, difficulty breathing, or chest pain. °· You have severe nausea that leads to persistent vomiting. °· You faint. °Summary °· After this procedure, it is common to have some pain, discomfort, or nausea when pieces (fragments) of the  kidney stone move through the tube that carries urine from the kidney to the bladder (ureter). If this pain or nausea is severe, however, you should contact your health care provider. °· Most people can resume normal activities 1-2 days after the procedure. Ask your health care provider what activities are safe for you. °· Drink enough water and fluids to keep your urine clear or pale yellow. This helps any remaining pieces of the stone to pass, and it can help prevent new stones from forming. °· If directed, strain your urine and keep all fragments for your health care provider to see. Fragments or stones may be as small as a grain of salt. °· Get help right away if you have severe pain in your back, sides, or upper abdomen or have severe pain while urinating. °This information is not intended to replace advice given to you by your health care provider. Make sure you discuss any questions you have with your health care provider. °Document Released: 09/24/2007 Document Revised: 07/26/2016 Document Reviewed: 07/26/2016 °Elsevier Interactive Patient Education © 2017 [employer] ° °

## 2017-05-26 NOTE — H&P (Signed)
HPI: Valerie Palmer is a 76 year-old female with a right renal calculus.  The problem is on the right side.   Valerie Palmer had a CT for hematuria on 12/29/16 and was found to have an 67mm right renal pelvic stone without obstruction. She has had no flank pain or gross hematuria but the UA has microhematuria today. She has delayed therapy because she was trying to sell her house. She is going to be closing and moving on 05/09/17.     CC/HPI: Incontinence     Valerie Palmer returns today in f/u for her UUI and she is now on maintenance PTNS and is please with the results. She has nocutria x 1 and only uses 2 ppd. Her frequency is still 6-7x daily. 4/6/18[name] returns in f/u for her history of UUI. She is on PTNS     ALLERGIES: Dilaudid TABS    MEDICATIONS: Diclofenac Sodium 50 MG Oral Tablet Delayed Release Oral  Folplex 2.2 TABS Oral  Losartan Potassium 50 mg tablet  Metamucil POWD Oral  Omeprazole 20 MG Oral Capsule Delayed Release Oral  Simvastatin 40 MG Oral Tablet Oral  Tylenol TABS Oral  Vitamin D (Ergocalciferol) 50000 UNIT Oral Capsule Oral     GU PSH: Complex cystometrogram, w/ void pressure and urethral pressure profile studies, any technique - 05/17/2016 Complex Uroflow - 05/17/2016 Emg surf Electrd - 05/17/2016 Inject For cystogram - 05/17/2016 Intrabd voidng Press - 05/17/2016 Locm 300-399Mg /Ml Iodine,1Ml - 12/29/2016 Ureteroscopic stone removal - 2010      PSH Notes: Tonsillectomy, Cystoscopy With Ureteroscopy With Removal Of Calculus, Breast Surgery Lumpectomy   kidney stone removal     NON-GU PSH: Neuroeltrd Stim Post Tibial - 04/13/2017, 04/06/2017, 03/30/2017, 03/23/2017, 03/09/2017, 03/02/2017, 02/23/2017, 02/16/2017, 02/09/2017, 02/02/2017, 01/26/2017, 01/19/2017 Remove Breast Lesion - 2010 Remove Tonsils - 2010    GU PMH: Renal calculus, She has an 23mm right renal pelvic stone but she is asymptomatic. I think she needs ESWL but she is in the midst of trying to sell her house so I will just  have her return in 6 weeks for reevalulation. - 03/19/2017, Kidney stone on left side, - 2014 Microscopic hematuria (Worsening), She has 3-10 RBC and no recent evaluation. I will get a BUN/Cr today and have her return for CT hematuria study and cystoscopy. 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  2012-11-13 12:00:29 - Note: Nephrolithiasis Of The Right Kidney   NON-GU PMH: Age-related osteoporosis without current pathological fracture, Osteoporosis - 2014 Personal history of other diseases of the circulatory system, History of hypertension - 2014 Personal history of other diseases of the digestive system, History of esophageal reflux - 2014 Personal history of other diseases of the nervous system and sense organs, History of sciatica - 2014 Arthritis GERD    FAMILY HISTORY: Death In The Family Father - Runs In Family Death In The Family Mother - Runs In Family Family Health Status Number - Runs In Family Heart problem - Father   SOCIAL HISTORY: Marital Status: Married Preferred Language: English; Ethnicity: Not Hispanic Or Latino; Race: White Current Smoking Status: Patient has never smoked.  Drinks 1 drink per day.  Drinks 2 caffeinated  drinks per day.     Notes: 1 daughter    REVIEW OF SYSTEMS:    GU Review Female:   Patient reports frequent urination and leakage of urine. Patient denies hard to postpone urination, burning /pain with urination, get up at night to urinate, stream starts and stops, trouble starting your stream, have to strain to urinate, and being pregnant.  Gastrointestinal (Upper):    Patient denies nausea, vomiting, and indigestion/ heartburn.  Gastrointestinal (Lower):   Patient denies diarrhea and constipation.  Constitutional:   Patient denies fever, night sweats, weight loss, and fatigue.  Skin:   Patient denies skin rash/ lesion and itching.  Eyes:   Patient denies blurred vision and double vision.  Ears/ Nose/ Throat:   Patient denies sore throat and sinus problems.  Hematologic/Lymphatic:   Patient denies easy bruising and swollen glands.  Cardiovascular:   Patient denies leg swelling and chest pains.  Respiratory:   Patient denies cough and shortness of breath.  Endocrine:   Patient denies excessive thirst.  Musculoskeletal:   Patient denies back pain and joint pain.  Neurological:   Patient denies headaches and dizziness.  Psychologic:   Patient denies depression and anxiety.   VITAL SIGNS:     BP 127/69 mmHg  Pulse 81 /min  Temperature 97.4 F / 36.3 C   MULTI-SYSTEM PHYSICAL EXAMINATION:    Constitutional: Well-nourished. No physical deformities. Normally developed. Good grooming.  Respiratory: No labored breathing, no use of accessory muscles. CTA  Cardiovascular: Normal temperature, RRR without murmur     PAST DATA REVIEWED:  Source Of History:  Patient  Urine Test Review:   Urinalysis   PROCEDURES:          Urinalysis w/Scope Dipstick Dipstick Cont'd Micro  Color: Yellow Bilirubin: Neg WBC/hpf: 0 - 5/hpf  Appearance: Clear Ketones: Neg RBC/hpf: 20 - 40/hpf  Specific Gravity: 1.025 Blood: 3+ Bacteria: Few (10-25/hpf)  pH: 6.0 Protein: Trace Cystals: NS (Not Seen)  Glucose: Neg Urobilinogen: 0.2 Casts: NS (Not Seen)    Nitrites: Neg Trichomonas: Not Present    Leukocyte Esterase: Neg Mucous: Not Present      Epithelial Cells: 0 - 5/hpf      Yeast: NS (Not Seen)      Sperm: Not Present    ASSESSMENT/PLAN:     ICD-10 Details  1 GU:   Renal calculus - N20.0 She has no pain but has persistent hematuria. She is set up for ESWL and I reviewed  the risks of the procedure with her again. She is moving in about 10 days and would like to proceed 3-4 weeks after that. I will get that scheduled.   2   Microscopic hematuria - R31.21 Stable  3   Urge incontinence - N39.41 Improving - She is pleased with her response to PTNS and will continue the maintenance.

## 2017-05-28 ENCOUNTER — Encounter (HOSPITAL_COMMUNITY): Payer: Self-pay | Admitting: Urology

## 2017-05-28 ENCOUNTER — Encounter (HOSPITAL_COMMUNITY)
Admission: RE | Disposition: A | Discharge status: Home / Self Care | Payer: Self-pay | Source: Ambulatory Visit | Attending: Urology

## 2017-05-28 ENCOUNTER — Ambulatory Visit (HOSPITAL_COMMUNITY): Payer: Medicare Other

## 2017-05-28 ENCOUNTER — Ambulatory Visit (HOSPITAL_COMMUNITY)
Admission: RE | Admit: 2017-05-28 | Discharge: 2017-05-28 | Disposition: A | Discharge status: Home / Self Care | Payer: Medicare Other | Source: Ambulatory Visit | Attending: Urology | Admitting: Urology

## 2017-05-28 DIAGNOSIS — N200 Calculus of kidney: Secondary | ICD-10-CM | POA: Insufficient documentation

## 2017-05-28 DIAGNOSIS — Z79899 Other long term (current) drug therapy: Secondary | ICD-10-CM | POA: Insufficient documentation

## 2017-05-28 DIAGNOSIS — I10 Essential (primary) hypertension: Secondary | ICD-10-CM | POA: Diagnosis not present

## 2017-05-28 HISTORY — DX: Essential (primary) hypertension: I10

## 2017-05-28 HISTORY — DX: Personal history of urinary calculi: Z87.442

## 2017-05-28 HISTORY — DX: Gastro-esophageal reflux disease without esophagitis: K21.9

## 2017-05-28 HISTORY — PX: EXTRACORPOREAL SHOCK WAVE LITHOTRIPSY: SHX1557

## 2017-05-28 HISTORY — DX: Pure hypercholesterolemia, unspecified: E78.00

## 2017-05-28 HISTORY — DX: Disease of spinal cord, unspecified: G95.9

## 2017-05-28 SURGERY — LITHOTRIPSY, ESWL
Anesthesia: LOCAL | Laterality: Right

## 2017-05-28 MED ORDER — SODIUM CHLORIDE 0.9 % IV SOLN
INTRAVENOUS | Status: DC
  Administered 2017-05-28: 10:00:00 via INTRAVENOUS

## 2017-05-28 MED ORDER — ONDANSETRON HCL 4 MG PO TABS: 4.0000 mg | ORAL_TABLET | Freq: Three times a day (TID) | ORAL | 0 refills | Status: DC | PRN

## 2017-05-28 MED ORDER — CIPROFLOXACIN HCL 500 MG PO TABS
500.0000 mg | ORAL_TABLET | ORAL | Status: AC
  Administered 2017-05-28: 500 mg via ORAL
  Filled 2017-05-28: qty 1

## 2017-05-28 MED ORDER — TAMSULOSIN HCL 0.4 MG PO CAPS: 0.4000 mg | ORAL_CAPSULE | ORAL | 0 refills | Status: AC

## 2017-05-28 MED ORDER — DIPHENHYDRAMINE HCL 25 MG PO CAPS
25.0000 mg | ORAL_CAPSULE | ORAL | Status: AC
  Administered 2017-05-28: 25 mg via ORAL
  Filled 2017-05-28: qty 1

## 2017-05-28 MED ORDER — HYDROCODONE-ACETAMINOPHEN 10-325 MG PO TABS: 1.0000 | ORAL_TABLET | ORAL | 0 refills | Status: DC | PRN

## 2017-05-28 MED ORDER — DIAZEPAM 5 MG PO TABS
10.0000 mg | ORAL_TABLET | ORAL | Status: AC
  Administered 2017-05-28: 10 mg via ORAL
  Filled 2017-05-28: qty 2

## 2017-05-28 NOTE — Op Note (Signed)
See [facility] OP note scanned into chart. Also because of the size, density, location and other factors that cannot be anticipated I feel this will likely be a staged procedure. This fact supersedes any indication in the scanned [facility] operative note to the contrary.  

## 2017-06-05 DIAGNOSIS — R3121 Asymptomatic microscopic hematuria: Secondary | ICD-10-CM | POA: Diagnosis not present

## 2017-06-05 DIAGNOSIS — N200 Calculus of kidney: Secondary | ICD-10-CM | POA: Diagnosis not present

## 2017-06-22 DIAGNOSIS — N3941 Urge incontinence: Secondary | ICD-10-CM | POA: Diagnosis not present

## 2017-07-05 DIAGNOSIS — N200 Calculus of kidney: Secondary | ICD-10-CM | POA: Diagnosis not present

## 2017-07-09 DIAGNOSIS — H16223 Keratoconjunctivitis sicca, not specified as Sjogren's, bilateral: Secondary | ICD-10-CM | POA: Diagnosis not present

## 2017-07-19 DIAGNOSIS — N3946 Mixed incontinence: Secondary | ICD-10-CM | POA: Diagnosis not present

## 2017-07-19 DIAGNOSIS — N3941 Urge incontinence: Secondary | ICD-10-CM | POA: Diagnosis not present

## 2017-08-15 DIAGNOSIS — Z87442 Personal history of urinary calculi: Secondary | ICD-10-CM | POA: Diagnosis not present

## 2017-08-15 DIAGNOSIS — R3121 Asymptomatic microscopic hematuria: Secondary | ICD-10-CM | POA: Diagnosis not present

## 2017-08-16 DIAGNOSIS — N3941 Urge incontinence: Secondary | ICD-10-CM | POA: Diagnosis not present

## 2017-08-21 DIAGNOSIS — Z79899 Other long term (current) drug therapy: Secondary | ICD-10-CM | POA: Diagnosis not present

## 2017-08-21 DIAGNOSIS — Z23 Encounter for immunization: Secondary | ICD-10-CM | POA: Diagnosis not present

## 2017-08-21 DIAGNOSIS — G959 Disease of spinal cord, unspecified: Secondary | ICD-10-CM | POA: Diagnosis not present

## 2017-08-21 DIAGNOSIS — K219 Gastro-esophageal reflux disease without esophagitis: Secondary | ICD-10-CM | POA: Diagnosis not present

## 2017-08-21 DIAGNOSIS — I10 Essential (primary) hypertension: Secondary | ICD-10-CM | POA: Diagnosis not present

## 2017-08-21 DIAGNOSIS — E785 Hyperlipidemia, unspecified: Secondary | ICD-10-CM | POA: Diagnosis not present

## 2017-08-21 DIAGNOSIS — Z0000 Encounter for general adult medical examination without abnormal findings: Secondary | ICD-10-CM | POA: Diagnosis not present

## 2017-09-06 DIAGNOSIS — N3941 Urge incontinence: Secondary | ICD-10-CM | POA: Diagnosis not present

## 2017-09-07 DIAGNOSIS — Z1231 Encounter for screening mammogram for malignant neoplasm of breast: Secondary | ICD-10-CM | POA: Diagnosis not present

## 2017-09-28 DIAGNOSIS — N3941 Urge incontinence: Secondary | ICD-10-CM | POA: Diagnosis not present

## 2017-10-23 DIAGNOSIS — N3941 Urge incontinence: Secondary | ICD-10-CM | POA: Diagnosis not present

## 2017-11-15 DIAGNOSIS — N3941 Urge incontinence: Secondary | ICD-10-CM | POA: Diagnosis not present

## 2017-12-13 DIAGNOSIS — N3941 Urge incontinence: Secondary | ICD-10-CM | POA: Diagnosis not present

## 2018-01-10 DIAGNOSIS — N3941 Urge incontinence: Secondary | ICD-10-CM | POA: Diagnosis not present

## 2018-02-12 DIAGNOSIS — N3946 Mixed incontinence: Secondary | ICD-10-CM | POA: Diagnosis not present

## 2018-02-12 DIAGNOSIS — N3941 Urge incontinence: Secondary | ICD-10-CM | POA: Diagnosis not present

## 2018-03-22 DIAGNOSIS — R3121 Asymptomatic microscopic hematuria: Secondary | ICD-10-CM | POA: Diagnosis not present

## 2018-03-22 DIAGNOSIS — N3941 Urge incontinence: Secondary | ICD-10-CM | POA: Diagnosis not present

## 2018-03-22 DIAGNOSIS — N200 Calculus of kidney: Secondary | ICD-10-CM | POA: Diagnosis not present

## 2018-04-18 ENCOUNTER — Other Ambulatory Visit: Payer: Self-pay

## 2018-04-23 DIAGNOSIS — N3941 Urge incontinence: Secondary | ICD-10-CM | POA: Diagnosis not present

## 2018-05-21 DIAGNOSIS — N3941 Urge incontinence: Secondary | ICD-10-CM | POA: Diagnosis not present

## 2018-06-18 DIAGNOSIS — N3941 Urge incontinence: Secondary | ICD-10-CM | POA: Diagnosis not present

## 2018-06-25 DIAGNOSIS — Z681 Body mass index (BMI) 19 or less, adult: Secondary | ICD-10-CM | POA: Diagnosis not present

## 2018-06-25 DIAGNOSIS — R600 Localized edema: Secondary | ICD-10-CM | POA: Diagnosis not present

## 2018-06-25 DIAGNOSIS — M545 Low back pain: Secondary | ICD-10-CM | POA: Diagnosis not present

## 2018-06-25 DIAGNOSIS — Z23 Encounter for immunization: Secondary | ICD-10-CM | POA: Diagnosis not present

## 2018-07-10 DIAGNOSIS — R636 Underweight: Secondary | ICD-10-CM | POA: Diagnosis not present

## 2018-07-10 DIAGNOSIS — M62830 Muscle spasm of back: Secondary | ICD-10-CM | POA: Diagnosis not present

## 2018-07-10 DIAGNOSIS — G959 Disease of spinal cord, unspecified: Secondary | ICD-10-CM | POA: Diagnosis not present

## 2018-07-10 DIAGNOSIS — M810 Age-related osteoporosis without current pathological fracture: Secondary | ICD-10-CM | POA: Diagnosis not present

## 2018-07-16 DIAGNOSIS — N3941 Urge incontinence: Secondary | ICD-10-CM | POA: Diagnosis not present

## 2018-08-08 ENCOUNTER — Other Ambulatory Visit: Payer: Self-pay

## 2018-08-09 DIAGNOSIS — N3946 Mixed incontinence: Secondary | ICD-10-CM | POA: Diagnosis not present

## 2018-09-03 DIAGNOSIS — N3941 Urge incontinence: Secondary | ICD-10-CM | POA: Diagnosis not present

## 2018-09-05 DIAGNOSIS — I10 Essential (primary) hypertension: Secondary | ICD-10-CM | POA: Diagnosis not present

## 2018-09-05 DIAGNOSIS — G959 Disease of spinal cord, unspecified: Secondary | ICD-10-CM | POA: Diagnosis not present

## 2018-09-05 DIAGNOSIS — R739 Hyperglycemia, unspecified: Secondary | ICD-10-CM | POA: Diagnosis not present

## 2018-09-05 DIAGNOSIS — Z79899 Other long term (current) drug therapy: Secondary | ICD-10-CM | POA: Diagnosis not present

## 2018-09-05 DIAGNOSIS — E785 Hyperlipidemia, unspecified: Secondary | ICD-10-CM | POA: Diagnosis not present

## 2018-09-05 DIAGNOSIS — Z0000 Encounter for general adult medical examination without abnormal findings: Secondary | ICD-10-CM | POA: Diagnosis not present

## 2018-09-05 DIAGNOSIS — K219 Gastro-esophageal reflux disease without esophagitis: Secondary | ICD-10-CM | POA: Diagnosis not present

## 2018-09-16 DIAGNOSIS — Z1231 Encounter for screening mammogram for malignant neoplasm of breast: Secondary | ICD-10-CM | POA: Diagnosis not present

## 2018-09-25 DIAGNOSIS — R3121 Asymptomatic microscopic hematuria: Secondary | ICD-10-CM | POA: Diagnosis not present

## 2018-09-25 DIAGNOSIS — N200 Calculus of kidney: Secondary | ICD-10-CM | POA: Diagnosis not present

## 2018-09-25 DIAGNOSIS — N3946 Mixed incontinence: Secondary | ICD-10-CM | POA: Diagnosis not present

## 2018-10-09 DIAGNOSIS — Z961 Presence of intraocular lens: Secondary | ICD-10-CM | POA: Diagnosis not present

## 2018-10-09 DIAGNOSIS — H16223 Keratoconjunctivitis sicca, not specified as Sjogren's, bilateral: Secondary | ICD-10-CM | POA: Diagnosis not present

## 2018-11-07 DIAGNOSIS — N3941 Urge incontinence: Secondary | ICD-10-CM | POA: Diagnosis not present

## 2018-11-08 DIAGNOSIS — D485 Neoplasm of uncertain behavior of skin: Secondary | ICD-10-CM | POA: Diagnosis not present

## 2018-11-08 DIAGNOSIS — L089 Local infection of the skin and subcutaneous tissue, unspecified: Secondary | ICD-10-CM | POA: Diagnosis not present

## 2018-11-08 DIAGNOSIS — I10 Essential (primary) hypertension: Secondary | ICD-10-CM | POA: Diagnosis not present

## 2018-11-08 DIAGNOSIS — G959 Disease of spinal cord, unspecified: Secondary | ICD-10-CM | POA: Diagnosis not present

## 2018-11-21 DIAGNOSIS — D2239 Melanocytic nevi of other parts of face: Secondary | ICD-10-CM | POA: Diagnosis not present

## 2018-11-21 DIAGNOSIS — L820 Inflamed seborrheic keratosis: Secondary | ICD-10-CM | POA: Diagnosis not present

## 2018-11-21 DIAGNOSIS — D485 Neoplasm of uncertain behavior of skin: Secondary | ICD-10-CM | POA: Diagnosis not present

## 2018-11-21 DIAGNOSIS — C44729 Squamous cell carcinoma of skin of left lower limb, including hip: Secondary | ICD-10-CM | POA: Diagnosis not present

## 2018-11-27 DIAGNOSIS — C44719 Basal cell carcinoma of skin of left lower limb, including hip: Secondary | ICD-10-CM | POA: Diagnosis not present

## 2018-12-16 DIAGNOSIS — L02416 Cutaneous abscess of left lower limb: Secondary | ICD-10-CM | POA: Diagnosis not present

## 2018-12-31 DIAGNOSIS — L0100 Impetigo, unspecified: Secondary | ICD-10-CM | POA: Diagnosis not present

## 2018-12-31 DIAGNOSIS — L97921 Non-pressure chronic ulcer of unspecified part of left lower leg limited to breakdown of skin: Secondary | ICD-10-CM | POA: Diagnosis not present

## 2019-01-14 DIAGNOSIS — L97921 Non-pressure chronic ulcer of unspecified part of left lower leg limited to breakdown of skin: Secondary | ICD-10-CM | POA: Diagnosis not present

## 2019-02-17 DIAGNOSIS — L97921 Non-pressure chronic ulcer of unspecified part of left lower leg limited to breakdown of skin: Secondary | ICD-10-CM | POA: Diagnosis not present

## 2019-02-18 DIAGNOSIS — N3941 Urge incontinence: Secondary | ICD-10-CM | POA: Diagnosis not present

## 2019-03-17 DIAGNOSIS — L97921 Non-pressure chronic ulcer of unspecified part of left lower leg limited to breakdown of skin: Secondary | ICD-10-CM | POA: Diagnosis not present

## 2019-04-14 DIAGNOSIS — L97921 Non-pressure chronic ulcer of unspecified part of left lower leg limited to breakdown of skin: Secondary | ICD-10-CM | POA: Diagnosis not present

## 2019-07-02 DIAGNOSIS — Z23 Encounter for immunization: Secondary | ICD-10-CM | POA: Diagnosis not present

## 2019-08-08 ENCOUNTER — Other Ambulatory Visit: Payer: Self-pay

## 2019-09-22 DIAGNOSIS — Z1231 Encounter for screening mammogram for malignant neoplasm of breast: Secondary | ICD-10-CM | POA: Diagnosis not present

## 2019-10-20 ENCOUNTER — Other Ambulatory Visit: Payer: Self-pay | Admitting: Urology

## 2019-10-20 DIAGNOSIS — N3946 Mixed incontinence: Secondary | ICD-10-CM | POA: Diagnosis not present

## 2019-10-20 DIAGNOSIS — N200 Calculus of kidney: Secondary | ICD-10-CM | POA: Diagnosis not present

## 2019-10-20 DIAGNOSIS — R3121 Asymptomatic microscopic hematuria: Secondary | ICD-10-CM | POA: Diagnosis not present

## 2019-11-06 ENCOUNTER — Other Ambulatory Visit (HOSPITAL_COMMUNITY): Payer: Medicare Other

## 2019-11-08 ENCOUNTER — Other Ambulatory Visit (HOSPITAL_COMMUNITY)
Admission: RE | Admit: 2019-11-08 | Discharge: 2019-11-08 | Disposition: A | Discharge status: Home / Self Care | Payer: Medicare Other | Source: Ambulatory Visit | Attending: Urology | Admitting: Urology

## 2019-11-08 DIAGNOSIS — Z01812 Encounter for preprocedural laboratory examination: Secondary | ICD-10-CM | POA: Insufficient documentation

## 2019-11-08 DIAGNOSIS — Z20822 Contact with and (suspected) exposure to covid-19: Secondary | ICD-10-CM | POA: Insufficient documentation

## 2019-11-08 LAB — SARS CORONAVIRUS 2 (TAT 6-24 HRS): SARS Coronavirus 2: NEGATIVE

## 2019-11-09 NOTE — H&P (Signed)
Incontinence     Nimco returns today in f/u for her UUI and she was now on maintenance PTNS but her benefit ran out. She is also on the Myrbetriq 50mg  daily. She has nocutria x 0-1 and only uses 2 ppd. Her frequency is q2-3 hours. She has some urgency and has to get to the BR quickly or she will leak. she has had no hematuria or dysuria. .     CC: I have kidney stones.  HPI: Valerie Palmer is a 79 year-old female established patient who is here for renal calculi.    She has a history of right renal stones with ESWL in 9/18. She has had residual stones on KUB but appears to have increased stone burden today. She has had no flank pain.     ALLERGIES: Dilaudid TABS - Made mouth and throat swell , couldn't breathe    MEDICATIONS: Myrbetriq 50 mg tablet, extended release 24 hr 1 tablet PO Daily  Diclofenac Sodium 50 MG Oral Tablet Delayed Release Oral  Folplex 2.2 TABS Oral  Losartan Potassium 50 mg tablet  Metamucil POWD Oral  Omeprazole 20 MG Oral Capsule Delayed Release Oral  Simvastatin 40 MG Oral Tablet Oral  Tylenol TABS Oral  Vitamin D (Ergocalciferol) 50000 UNIT Oral Capsule Oral     GU PSH: Complex cystometrogram, w/ void pressure and urethral pressure profile studies, any technique - 2017 Complex Uroflow - 2017 Emg surf Electrd - 2017 ESWL - 05/28/2017 Inject For cystogram - 2017 Intrabd voidng Press - 2017 Locm 300-399Mg /Ml Iodine,1Ml - 2018 Ureteroscopic stone removal - 2010       PSH Notes: Tonsillectomy, Cystoscopy With Ureteroscopy With Removal Of Calculus, Breast Surgery Lumpectomy   kidney stone removal     NON-GU PSH: Neuroeltrd Stim Post Tibial - 02/18/2019, 11/07/2018, 09/03/2018, 08/09/2018, 07/16/2018, 06/18/2018, 05/21/2018, 05/21/2018, 04/23/2018, 03/22/2018, 03/22/2018, 03/22/2018, 02/12/2018, 01/10/2018, 12/13/2017, 11/15/2017, 10/23/2017, 2019, 09/06/2017, 08/16/2017, 07/19/2017, 06/22/2017, 05/25/2017, 05/04/2017, 2018, 2018, 2018, 2018, 2018, 2018, 2018, 2018, 2018, 2018, 2018,  2018 Remove Breast Lesion - 2010 Remove Tonsils - 2010     GU PMH: Renal calculus (Stable), Right, F/U in 1 year with a KUB. - 09/25/2018, (Worsening), There is some increase in the RLP stones. I will have her return in 6 months with a KUB. , - 03/22/2018, She has an 31mm right renal pelvic stone but she is asymptomatic. I think she needs ESWL but she is in the midst of trying to sell her house so I will just have her return in 6 weeks for reevalulation. , - 2018, Kidney stone on left side, - 2014 Microscopic hematuria (Worsening), She has 3-10 RBC and no recent evaluation. I will get a BUN/Cr today and have her return for CT hematuria study and cystoscopy. 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  Drinks 1 drink per day.  Drinks 2 caffeinated drinks per day.     Notes: 1 daughter    REVIEW OF SYSTEMS:    GU Review Female:   Patient reports frequent urination, hard to postpone urination, leakage of urine, and stream starts and stops. Patient denies burning /pain with urination, get up at night to urinate,  trouble starting your stream, have to strain to urinate, and being pregnant.  Gastrointestinal (Upper):   Patient denies nausea, vomiting, and indigestion/ heartburn.  Gastrointestinal (Lower):   Patient denies diarrhea and constipation.  Constitutional:   Patient denies fever, night sweats, weight loss, and fatigue.  Skin:   Patient denies skin rash/ lesion and itching.  Eyes:   Patient denies blurred vision and double vision.  Ears/ Nose/ Throat:   Patient denies sore throat and sinus problems.  Hematologic/Lymphatic:   Patient denies swollen glands and easy bruising.  Cardiovascular:   Patient denies leg swelling and chest pains.  Respiratory:   Patient denies cough and shortness of breath.  Endocrine:   Patient denies excessive thirst.  Musculoskeletal:   Patient denies back pain and joint pain.  Neurological:   Patient denies headaches and dizziness.  Psychologic:   Patient denies depression and anxiety.   VITAL SIGNS:      10/20/2019 09:21 AM  Weight 113 lb / 51.26 kg  Height 64.5 in / 163.83 cm  BP 138/80 mmHg  Pulse 90 /min  Temperature 97.9 F / 36.6 C  BMI 19.1 kg/m   MULTI-SYSTEM PHYSICAL EXAMINATION:    Constitutional: Well-nourished. No physical deformities. Normally developed. Good grooming.  Respiratory: Normal breath sounds. No labored breathing, no use of accessory muscles.   Cardiovascular: Regular rate and rhythm. No murmur, no gallop.      PAST DATA REVIEWED:  Source Of History:  Patient  Urine Test Review:   Urinalysis  X-Ray Review: KUB: Reviewed Films. Discussed With Patient.  [facility] Protocol: Reviewed Films. Discussed With Patient.     PROCEDURES:         C.T[facility] Urogram - [id number]  She has a 1.2cm right renal pelvic stone and 1.2cm RLP stone. There is no obstruction. full report to follow.       Patient confirmed No Neulasta OnPro Device.            KUB - [id number]  A single view of the abdomen is obtained. There are right renal stones with a  question of a 2.5cm stone in the lower pole vs bowel content and a 1cm stone overlying the medial mid kidney. She has lumbar DDD with spurring. No gas or soft tissue abnormalities noted.       Patient confirmed No Neulasta OnPro Device.           Urinalysis w/Scope - 81001 Dipstick Dipstick Cont'd Micro  Color: Yellow Bilirubin: Neg mg/dL WBC/hpf: 0 - 5/hpf  Appearance: Clear Ketones: Neg mg/dL RBC/hpf: 3 - 10/hpf  Specific Gravity: 1.025 Blood: 2+ ery/uL Bacteria: Rare (0-9/hpf)  pH: <=5.0 Protein: 1+ mg/dL Cystals: NS (Not Seen)  Glucose: Neg mg/dL Urobilinogen: 0.2 mg/dL Casts: NS (Not Seen)    Nitrites: Neg Trichomonas: Not Present    Leukocyte Esterase: Neg leu/uL Mucous: Present      Epithelial Cells: 0 - 5/hpf      Yeast: NS (Not Seen)      Sperm:  Not Present    ASSESSMENT:      ICD-10 Details  1 GU:   Mixed incontinence - N39.46 Chronic, Worsening - Her incontinence is worse off of the PTNS. I have given her Myrbetriq 50mg  samples and will have her continue that. I will reach out to [facility] about resumption of PTNS.   2   Renal calculus - N20.0 Chronic, Exacerbation - She has interval enlargement of the right renal stones with a 1.2cm renal pelvic stone and a 1.2cm RLP stone. I discussed PCNL, URS and ESWL and the attendant risks, benefits and stone free rates. She would like to proceed with ESWL. I reviewed the risks of ESWL including bleeding, infection, injury to the kidney or adjacent structures, failure to fragment the stone, need for ancillary procedures, thrombotic events, cardiac arrhythmias and sedation complications.   3   Microscopic hematuria - R31.21 Chronic, Stable - 3-10 RBC's today.    PLAN:            Medications Refill Meds: Myrbetriq 50 mg tablet, extended release 24 hr 1 tablet PO Daily   #30  11 Refill(s)            Orders X-Rays: [facility] Protocol Without Contrast  X-Ray Notes: History:  Hematuria: Yes/No  Patient to see MD after exam:  Yes/No  Previous exam: CT / IVP/ US/ KUB/ None  When:  Where:  Diabetic: Yes/ No  BUN/ Creatinine:  Date of last BUN Creatinine:  Weight in pounds:  Allergy- IV Contrast: Yes/ No  Conflicting diabetic meds: Yes/ No  Diabetic Meds:  Prior Authorization #: [id number]           Schedule Return Visit/Planned Activity: Next Available Appointment - Schedule Surgery             Note: right ESWL.  Procedure: Approximately 3 Weeks - ESWL - [phone]

## 2019-11-10 ENCOUNTER — Encounter (HOSPITAL_BASED_OUTPATIENT_CLINIC_OR_DEPARTMENT_OTHER): Payer: Self-pay | Admitting: Urology

## 2019-11-10 ENCOUNTER — Ambulatory Visit (HOSPITAL_BASED_OUTPATIENT_CLINIC_OR_DEPARTMENT_OTHER)
Admission: RE | Admit: 2019-11-10 | Discharge: 2019-11-10 | Disposition: A | Discharge status: Home / Self Care | Payer: Medicare Other | Attending: Urology | Admitting: Urology

## 2019-11-10 ENCOUNTER — Encounter (HOSPITAL_BASED_OUTPATIENT_CLINIC_OR_DEPARTMENT_OTHER)
Admission: RE | Disposition: A | Discharge status: Home / Self Care | Payer: Self-pay | Source: Home / Self Care | Attending: Urology

## 2019-11-10 ENCOUNTER — Ambulatory Visit (HOSPITAL_COMMUNITY): Payer: Medicare Other

## 2019-11-10 DIAGNOSIS — N3946 Mixed incontinence: Secondary | ICD-10-CM | POA: Insufficient documentation

## 2019-11-10 DIAGNOSIS — Z87442 Personal history of urinary calculi: Secondary | ICD-10-CM | POA: Insufficient documentation

## 2019-11-10 DIAGNOSIS — N200 Calculus of kidney: Secondary | ICD-10-CM | POA: Insufficient documentation

## 2019-11-10 DIAGNOSIS — Z79899 Other long term (current) drug therapy: Secondary | ICD-10-CM | POA: Insufficient documentation

## 2019-11-10 DIAGNOSIS — K219 Gastro-esophageal reflux disease without esophagitis: Secondary | ICD-10-CM | POA: Insufficient documentation

## 2019-11-10 HISTORY — DX: Myoneural disorder, unspecified: G70.9

## 2019-11-10 HISTORY — PX: EXTRACORPOREAL SHOCK WAVE LITHOTRIPSY: SHX1557

## 2019-11-10 HISTORY — DX: Hereditary spastic paraplegia: G11.4

## 2019-11-10 SURGERY — LITHOTRIPSY, ESWL
Anesthesia: LOCAL | Laterality: Right

## 2019-11-10 MED ORDER — DIAZEPAM 5 MG PO TABS
10.0000 mg | ORAL_TABLET | ORAL | Status: AC
  Administered 2019-11-10: 10:00:00 10 mg via ORAL
  Filled 2019-11-10: qty 2

## 2019-11-10 MED ORDER — HYDROCODONE-ACETAMINOPHEN 5-325 MG PO TABS: 1.0000 | ORAL_TABLET | Freq: Four times a day (QID) | ORAL | 0 refills | Status: AC | PRN

## 2019-11-10 MED ORDER — DIAZEPAM 5 MG PO TABS
ORAL_TABLET | ORAL | Status: AC
  Filled 2019-11-10: qty 2

## 2019-11-10 MED ORDER — DIPHENHYDRAMINE HCL 25 MG PO CAPS
25.0000 mg | ORAL_CAPSULE | ORAL | Status: AC
  Administered 2019-11-10: 10:00:00 25 mg via ORAL
  Filled 2019-11-10: qty 1

## 2019-11-10 MED ORDER — SODIUM CHLORIDE 0.9 % IV SOLN
INTRAVENOUS | Status: DC
  Filled 2019-11-10: qty 1000

## 2019-11-10 MED ORDER — SODIUM CHLORIDE 0.9% FLUSH
3.0000 mL | Freq: Two times a day (BID) | INTRAVENOUS | Status: DC
  Filled 2019-11-10: qty 3

## 2019-11-10 MED ORDER — CIPROFLOXACIN HCL 500 MG PO TABS
ORAL_TABLET | ORAL | Status: AC
  Filled 2019-11-10: qty 1

## 2019-11-10 MED ORDER — CIPROFLOXACIN HCL 500 MG PO TABS
500.0000 mg | ORAL_TABLET | ORAL | Status: AC
  Administered 2019-11-10: 10:00:00 500 mg via ORAL
  Filled 2019-11-10: qty 1

## 2019-11-10 MED ORDER — DIPHENHYDRAMINE HCL 25 MG PO CAPS
ORAL_CAPSULE | ORAL | Status: AC
  Filled 2019-11-10: qty 1

## 2019-11-10 MED ORDER — ONDANSETRON HCL 4 MG PO TABS: 4.0000 mg | ORAL_TABLET | Freq: Three times a day (TID) | ORAL | 0 refills | Status: AC | PRN

## 2019-11-10 NOTE — Discharge Instructions (Signed)
Lithotripsy, Care After This sheet gives you information about how to care for yourself after your procedure. Your health care provider may also give you more specific instructions. If you have problems or questions, contact your health care provider. What can I expect after the procedure? After the procedure, it is common to have:  Some blood in your urine. This should only last for a few days.  Soreness in your back, sides, or upper abdomen for a few days.  Blotches or bruises on your back where the pressure wave entered the skin.  Pain, discomfort, or nausea when pieces (fragments) of the kidney stone move through the tube that carries urine from the kidney to the bladder (ureter). Stone fragments may pass soon after the procedure, but they may continue to pass for up to 4-8 weeks. ? If you have severe pain or nausea, contact your health care provider. This may be caused by a large stone that was not broken up, and this may mean that you need more treatment.  Some pain or discomfort during urination.  Some pain or discomfort in the lower abdomen or (in men) at the base of the penis. Follow these instructions at home: Medicines  Take over-the-counter and prescription medicines only as told by your health care provider.  If you were prescribed an antibiotic medicine, take it as told by your health care provider. Do not stop taking the antibiotic even if you start to feel better.  Do not drive for 24 hours if you were given a medicine to help you relax (sedative).  Do not drive or use heavy machinery while taking prescription pain medicine. Eating and drinking      Drink enough water and fluids to keep your urine clear or pale yellow. This helps any remaining pieces of the stone to pass. It can also help prevent new stones from forming.  Eat plenty of fresh fruits and vegetables.  Follow instructions from your health care provider about eating and drinking restrictions. You may be  instructed: ? To reduce how much salt (sodium) you eat or drink. Check ingredients and nutrition facts on packaged foods and beverages. ? To reduce how much meat you eat.  Eat the recommended amount of calcium for your age and gender. Ask your health care provider how much calcium you should have. General instructions  Get plenty of rest.  Most people can resume normal activities 1-2 days after the procedure. Ask your health care provider what activities are safe for you.  Your health care provider may direct you to lie in a certain position (postural drainage) and tap firmly (percuss) over your kidney area to help stone fragments pass. Follow instructions as told by your health care provider.  If directed, strain all urine through the strainer that was provided by your health care provider. ? Keep all fragments for your health care provider to see. Any stones that are found may be sent to a medical lab for examination. The stone may be as small as a grain of salt.  Keep all follow-up visits as told by your health care provider. This is important. Contact a health care provider if:  You have pain that is severe or does not get better with medicine.  You have nausea that is severe or does not go away.  You have blood in your urine longer than your health care provider told you to expect.  You have more blood in your urine.  You have pain during urination that does   not go away.  You urinate more frequently than usual and this does not go away.  You develop a rash or any other possible signs of an allergic reaction. Get help right away if:  You have severe pain in your back, sides, or upper abdomen.  You have severe pain while urinating.  Your urine is very dark red.  You have blood in your stool (feces).  You cannot pass any urine at all.  You feel a strong urge to urinate after emptying your bladder.  You have a fever or chills.  You develop shortness of breath,  difficulty breathing, or chest pain.  You have severe nausea that leads to persistent vomiting.  You faint. Summary  After this procedure, it is common to have some pain, discomfort, or nausea when pieces (fragments) of the kidney stone move through the tube that carries urine from the kidney to the bladder (ureter). If this pain or nausea is severe, however, you should contact your health care provider.  Most people can resume normal activities 1-2 days after the procedure. Ask your health care provider what activities are safe for you.  Drink enough water and fluids to keep your urine clear or pale yellow. This helps any remaining pieces of the stone to pass, and it can help prevent new stones from forming.  If directed, strain your urine and keep all fragments for your health care provider to see. Fragments or stones may be as small as a grain of salt.  Get help right away if you have severe pain in your back, sides, or upper abdomen or have severe pain while urinating. This information is not intended to replace advice given to you by your health care provider. Make sure you discuss any questions you have with your health care provider. Document Revised: 12/16/2018 Document Reviewed: 07/26/2016 [employer] Patient Education  [place] Instructions  Activity: Get plenty of rest for the remainder of the day. A responsible individual must stay with you for 24 hours following the procedure.  For the next 24 hours, DO NOT: -Drive a car -Paediatric nurse -Drink alcoholic beverages -Take any medication unless instructed by your physician -Make any legal decisions or sign important papers.  Meals: Start with liquid foods such as gelatin or soup. Progress to regular foods as tolerated. Avoid greasy, spicy, heavy foods. If nausea and/or vomiting occur, drink only clear liquids until the nausea and/or vomiting subsides. Call your physician if vomiting  continues.  Special Instructions/Symptoms: Your throat may feel dry or sore from the anesthesia or the breathing tube placed in your throat during surgery. If this causes discomfort, gargle with warm salt water. The discomfort should disappear within 24 hours.

## 2019-11-10 NOTE — Interval H&P Note (Signed)
History and Physical Interval Note: no change  11/10/2019 10:14 AM  Valerie Palmer  has presented today for surgery, with the diagnosis of RIGHT RENAL STONE.  The various methods of treatment have been discussed with the patient and family. After consideration of risks, benefits and other options for treatment, the patient has consented to  Procedure(s): EXTRACORPOREAL SHOCK WAVE LITHOTRIPSY (ESWL) (Right) as a surgical intervention.  The patient's history has been reviewed, patient examined, no change in status, stable for surgery.  I have reviewed the patient's chart and labs.  Questions were answered to the patient's satisfaction.     Irine Seal

## 2019-11-16 DIAGNOSIS — K219 Gastro-esophageal reflux disease without esophagitis: Secondary | ICD-10-CM | POA: Diagnosis not present

## 2019-11-16 DIAGNOSIS — I10 Essential (primary) hypertension: Secondary | ICD-10-CM | POA: Diagnosis not present

## 2019-11-24 DIAGNOSIS — N200 Calculus of kidney: Secondary | ICD-10-CM | POA: Diagnosis not present

## 2019-11-26 DIAGNOSIS — H26493 Other secondary cataract, bilateral: Secondary | ICD-10-CM | POA: Diagnosis not present

## 2019-11-26 DIAGNOSIS — H524 Presbyopia: Secondary | ICD-10-CM | POA: Diagnosis not present

## 2019-11-26 DIAGNOSIS — H52223 Regular astigmatism, bilateral: Secondary | ICD-10-CM | POA: Diagnosis not present

## 2019-11-26 DIAGNOSIS — Z961 Presence of intraocular lens: Secondary | ICD-10-CM | POA: Diagnosis not present

## 2019-12-30 DIAGNOSIS — N200 Calculus of kidney: Secondary | ICD-10-CM | POA: Diagnosis not present

## 2020-06-17 DIAGNOSIS — Z23 Encounter for immunization: Secondary | ICD-10-CM | POA: Diagnosis not present

## 2020-07-21 DIAGNOSIS — N3946 Mixed incontinence: Secondary | ICD-10-CM | POA: Diagnosis not present

## 2020-07-21 DIAGNOSIS — N200 Calculus of kidney: Secondary | ICD-10-CM | POA: Diagnosis not present

## 2020-08-24 DIAGNOSIS — D224 Melanocytic nevi of scalp and neck: Secondary | ICD-10-CM | POA: Diagnosis not present

## 2020-09-13 DIAGNOSIS — R7302 Impaired glucose tolerance (oral): Secondary | ICD-10-CM | POA: Diagnosis not present

## 2020-09-13 DIAGNOSIS — G959 Disease of spinal cord, unspecified: Secondary | ICD-10-CM | POA: Diagnosis not present

## 2020-09-13 DIAGNOSIS — R202 Paresthesia of skin: Secondary | ICD-10-CM | POA: Diagnosis not present

## 2020-09-13 DIAGNOSIS — E785 Hyperlipidemia, unspecified: Secondary | ICD-10-CM | POA: Diagnosis not present

## 2020-09-13 DIAGNOSIS — I10 Essential (primary) hypertension: Secondary | ICD-10-CM | POA: Diagnosis not present

## 2020-09-13 DIAGNOSIS — Z79899 Other long term (current) drug therapy: Secondary | ICD-10-CM | POA: Diagnosis not present

## 2020-09-13 DIAGNOSIS — Z0000 Encounter for general adult medical examination without abnormal findings: Secondary | ICD-10-CM | POA: Diagnosis not present

## 2020-12-16 DIAGNOSIS — Z1231 Encounter for screening mammogram for malignant neoplasm of breast: Secondary | ICD-10-CM | POA: Diagnosis not present

## 2021-01-05 DIAGNOSIS — Z961 Presence of intraocular lens: Secondary | ICD-10-CM | POA: Diagnosis not present

## 2021-01-05 DIAGNOSIS — H52223 Regular astigmatism, bilateral: Secondary | ICD-10-CM | POA: Diagnosis not present

## 2021-01-05 DIAGNOSIS — H26492 Other secondary cataract, left eye: Secondary | ICD-10-CM | POA: Diagnosis not present

## 2021-01-05 DIAGNOSIS — H5211 Myopia, right eye: Secondary | ICD-10-CM | POA: Diagnosis not present

## 2021-01-05 DIAGNOSIS — H26491 Other secondary cataract, right eye: Secondary | ICD-10-CM | POA: Diagnosis not present

## 2021-01-05 DIAGNOSIS — H524 Presbyopia: Secondary | ICD-10-CM | POA: Diagnosis not present

## 2021-01-13 DIAGNOSIS — R0600 Dyspnea, unspecified: Secondary | ICD-10-CM | POA: Diagnosis not present

## 2021-01-13 DIAGNOSIS — M7989 Other specified soft tissue disorders: Secondary | ICD-10-CM | POA: Diagnosis not present

## 2021-01-13 DIAGNOSIS — G959 Disease of spinal cord, unspecified: Secondary | ICD-10-CM | POA: Diagnosis not present

## 2021-01-13 DIAGNOSIS — I351 Nonrheumatic aortic (valve) insufficiency: Secondary | ICD-10-CM | POA: Diagnosis not present

## 2021-01-18 DIAGNOSIS — R0600 Dyspnea, unspecified: Secondary | ICD-10-CM | POA: Diagnosis not present

## 2021-01-18 DIAGNOSIS — I361 Nonrheumatic tricuspid (valve) insufficiency: Secondary | ICD-10-CM | POA: Diagnosis not present

## 2021-01-18 DIAGNOSIS — I340 Nonrheumatic mitral (valve) insufficiency: Secondary | ICD-10-CM | POA: Diagnosis not present

## 2021-02-15 DIAGNOSIS — I10 Essential (primary) hypertension: Secondary | ICD-10-CM | POA: Diagnosis not present

## 2021-02-15 DIAGNOSIS — I5189 Other ill-defined heart diseases: Secondary | ICD-10-CM | POA: Diagnosis not present

## 2021-02-15 DIAGNOSIS — R636 Underweight: Secondary | ICD-10-CM | POA: Diagnosis not present

## 2021-02-15 DIAGNOSIS — I878 Other specified disorders of veins: Secondary | ICD-10-CM | POA: Diagnosis not present

## 2021-05-11 IMAGING — DX DG ABDOMEN 1V
1 series · 1 of 1 positions shown · non-contrast
Comparison: CT abdomen and pelvis 10/20/2019 and plain film
evaluation of 10/20/2019

CLINICAL DATA: Pre lithotripsy right-sided renal calculus

EXAM:
ABDOMEN - 1 VIEW

[abdomen kub]
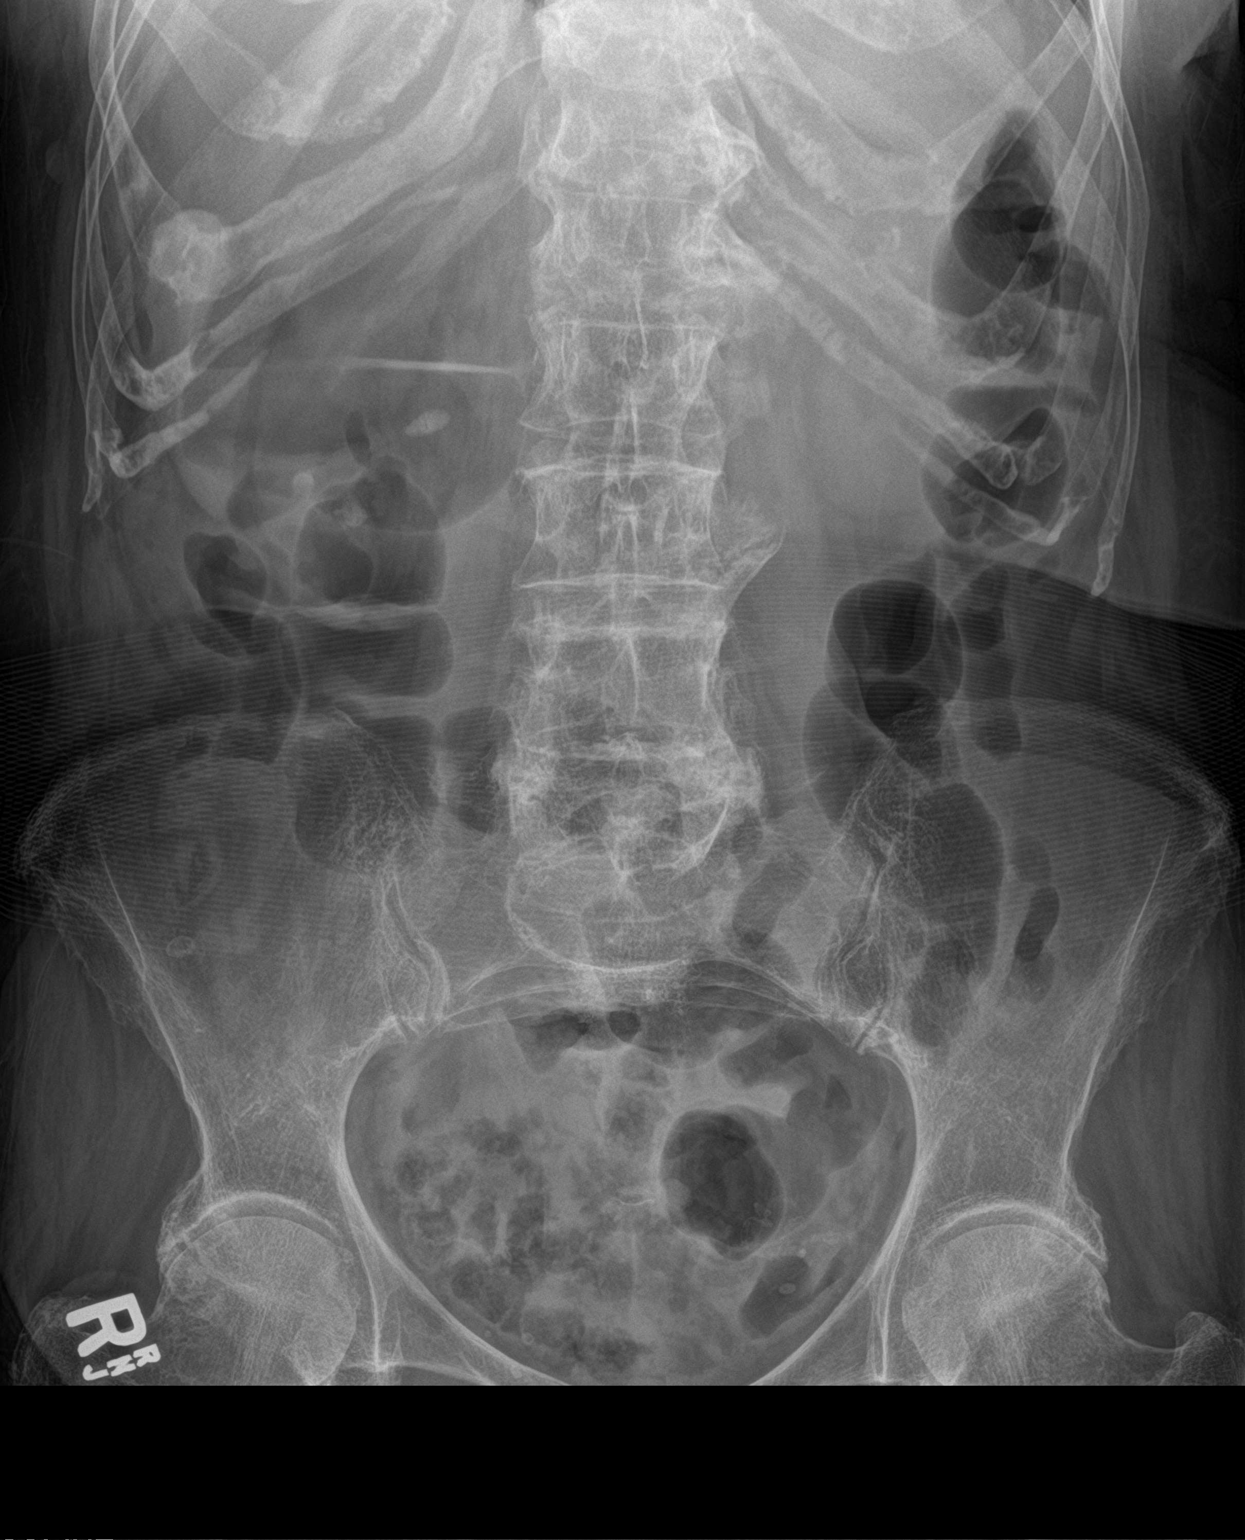

[1 of 1 positions shown; findings below may reference images not displayed]

FINDINGS: 12 mm calculus in the area of the right renal pelvis projecting to
the right of the spine at the L2-3 level. Lower pole calculus as
well remains visible. This measures approximately 6-7 mm, projecting
to the right of L3.

No additional calculi.

Osteopenia and spinal degenerative changes.

Bowel gas pattern with mildly dilated loops of bowel in the right
upper quadrant.
IMPRESSION: 12 mm calculus in the area of the right renal pelvis projecting to
the right of the spine at the L2-3 level. Lower pole calculus also
remains visible.

## 2021-06-14 DIAGNOSIS — M25474 Effusion, right foot: Secondary | ICD-10-CM | POA: Diagnosis not present

## 2021-06-14 DIAGNOSIS — M25471 Effusion, right ankle: Secondary | ICD-10-CM | POA: Diagnosis not present

## 2021-06-14 DIAGNOSIS — M25475 Effusion, left foot: Secondary | ICD-10-CM | POA: Diagnosis not present

## 2021-06-14 DIAGNOSIS — M25472 Effusion, left ankle: Secondary | ICD-10-CM | POA: Diagnosis not present

## 2021-07-13 DIAGNOSIS — Z23 Encounter for immunization: Secondary | ICD-10-CM | POA: Diagnosis not present

## 2021-09-22 DIAGNOSIS — S50311A Abrasion of right elbow, initial encounter: Secondary | ICD-10-CM | POA: Diagnosis not present

## 2021-09-22 DIAGNOSIS — Z0000 Encounter for general adult medical examination without abnormal findings: Secondary | ICD-10-CM | POA: Diagnosis not present

## 2021-09-22 DIAGNOSIS — I10 Essential (primary) hypertension: Secondary | ICD-10-CM | POA: Diagnosis not present

## 2021-09-22 DIAGNOSIS — G959 Disease of spinal cord, unspecified: Secondary | ICD-10-CM | POA: Diagnosis not present

## 2021-09-22 DIAGNOSIS — R7302 Impaired glucose tolerance (oral): Secondary | ICD-10-CM | POA: Diagnosis not present

## 2021-09-22 DIAGNOSIS — E785 Hyperlipidemia, unspecified: Secondary | ICD-10-CM | POA: Diagnosis not present

## 2021-09-22 DIAGNOSIS — Z79899 Other long term (current) drug therapy: Secondary | ICD-10-CM | POA: Diagnosis not present

## 2022-07-03 DIAGNOSIS — Z23 Encounter for immunization: Secondary | ICD-10-CM | POA: Diagnosis not present

## 2022-09-29 DIAGNOSIS — Z0000 Encounter for general adult medical examination without abnormal findings: Secondary | ICD-10-CM | POA: Diagnosis not present

## 2022-09-29 DIAGNOSIS — G959 Disease of spinal cord, unspecified: Secondary | ICD-10-CM | POA: Diagnosis not present

## 2022-09-29 DIAGNOSIS — Z789 Other specified health status: Secondary | ICD-10-CM | POA: Diagnosis not present

## 2022-09-29 DIAGNOSIS — Z79899 Other long term (current) drug therapy: Secondary | ICD-10-CM | POA: Diagnosis not present

## 2022-09-29 DIAGNOSIS — R7302 Impaired glucose tolerance (oral): Secondary | ICD-10-CM | POA: Diagnosis not present

## 2022-09-29 DIAGNOSIS — Z7409 Other reduced mobility: Secondary | ICD-10-CM | POA: Diagnosis not present

## 2022-09-29 DIAGNOSIS — E785 Hyperlipidemia, unspecified: Secondary | ICD-10-CM | POA: Diagnosis not present

## 2022-09-29 DIAGNOSIS — M6281 Muscle weakness (generalized): Secondary | ICD-10-CM | POA: Diagnosis not present

## 2022-09-29 DIAGNOSIS — M810 Age-related osteoporosis without current pathological fracture: Secondary | ICD-10-CM | POA: Diagnosis not present

## 2023-07-06 DIAGNOSIS — Z23 Encounter for immunization: Secondary | ICD-10-CM | POA: Diagnosis not present

## 2023-10-01 DIAGNOSIS — Z79899 Other long term (current) drug therapy: Secondary | ICD-10-CM | POA: Diagnosis not present

## 2023-10-01 DIAGNOSIS — R7301 Impaired fasting glucose: Secondary | ICD-10-CM | POA: Diagnosis not present

## 2023-10-01 DIAGNOSIS — I10 Essential (primary) hypertension: Secondary | ICD-10-CM | POA: Diagnosis not present

## 2023-10-01 DIAGNOSIS — G63 Polyneuropathy in diseases classified elsewhere: Secondary | ICD-10-CM | POA: Diagnosis not present

## 2023-10-01 DIAGNOSIS — E785 Hyperlipidemia, unspecified: Secondary | ICD-10-CM | POA: Diagnosis not present

## 2023-10-01 DIAGNOSIS — E559 Vitamin D deficiency, unspecified: Secondary | ICD-10-CM | POA: Diagnosis not present

## 2023-10-01 DIAGNOSIS — Z0000 Encounter for general adult medical examination without abnormal findings: Secondary | ICD-10-CM | POA: Diagnosis not present

## 2023-10-01 DIAGNOSIS — G959 Disease of spinal cord, unspecified: Secondary | ICD-10-CM | POA: Diagnosis not present

## 2024-03-31 DIAGNOSIS — I5189 Other ill-defined heart diseases: Secondary | ICD-10-CM | POA: Diagnosis not present

## 2024-03-31 DIAGNOSIS — I878 Other specified disorders of veins: Secondary | ICD-10-CM | POA: Diagnosis not present

## 2024-03-31 DIAGNOSIS — G63 Polyneuropathy in diseases classified elsewhere: Secondary | ICD-10-CM | POA: Diagnosis not present

## 2024-03-31 DIAGNOSIS — G959 Disease of spinal cord, unspecified: Secondary | ICD-10-CM | POA: Diagnosis not present

## 2024-05-29 DIAGNOSIS — Z23 Encounter for immunization: Secondary | ICD-10-CM | POA: Diagnosis not present

## 2024-06-10 DIAGNOSIS — H43813 Vitreous degeneration, bilateral: Secondary | ICD-10-CM | POA: Diagnosis not present

## 2024-06-10 DIAGNOSIS — H52223 Regular astigmatism, bilateral: Secondary | ICD-10-CM | POA: Diagnosis not present

## 2024-06-10 DIAGNOSIS — Z9849 Cataract extraction status, unspecified eye: Secondary | ICD-10-CM | POA: Diagnosis not present

## 2024-06-10 DIAGNOSIS — Z961 Presence of intraocular lens: Secondary | ICD-10-CM | POA: Diagnosis not present

## 2024-06-10 DIAGNOSIS — H524 Presbyopia: Secondary | ICD-10-CM | POA: Diagnosis not present

## 2024-06-10 DIAGNOSIS — H5213 Myopia, bilateral: Secondary | ICD-10-CM | POA: Diagnosis not present

## 2024-08-27 DIAGNOSIS — M25552 Pain in left hip: Secondary | ICD-10-CM | POA: Diagnosis not present

## 2024-08-27 DIAGNOSIS — M25551 Pain in right hip: Secondary | ICD-10-CM | POA: Diagnosis not present

## 2024-08-27 DIAGNOSIS — Z681 Body mass index (BMI) 19 or less, adult: Secondary | ICD-10-CM | POA: Diagnosis not present

## 2024-10-24 ENCOUNTER — Encounter: Admitting: Podiatry

## 2024-10-24 NOTE — Progress Notes (Signed)
Patient did not show for scheduled appointment today.
